# Patient Record
Sex: Male | Born: 1950 | Race: Black or African American | Hispanic: No | Marital: Married | Smoking: Former smoker
Health system: Southern US, Community
[De-identification: ages and names within clinical notes are randomized; demographics above are authoritative.]

## PROBLEM LIST (undated history)

## (undated) DIAGNOSIS — I2699 Other pulmonary embolism without acute cor pulmonale: Secondary | ICD-10-CM

## (undated) DIAGNOSIS — I10 Essential (primary) hypertension: Secondary | ICD-10-CM

## (undated) DIAGNOSIS — E785 Hyperlipidemia, unspecified: Secondary | ICD-10-CM

## (undated) DIAGNOSIS — E041 Nontoxic single thyroid nodule: Secondary | ICD-10-CM

## (undated) HISTORY — PX: SPLENECTOMY: SUR1306

## (undated) HISTORY — DX: Other pulmonary embolism without acute cor pulmonale: I26.99

## (undated) HISTORY — DX: Hyperlipidemia, unspecified: E78.5

## (undated) HISTORY — PX: CERVICAL FUSION: SHX112

## (undated) HISTORY — DX: Nontoxic single thyroid nodule: E04.1

## (undated) HISTORY — DX: Essential (primary) hypertension: I10

---

## 2006-08-19 ENCOUNTER — Ambulatory Visit: Payer: Self-pay | Admitting: Oncology

## 2010-03-23 ENCOUNTER — Emergency Department (HOSPITAL_BASED_OUTPATIENT_CLINIC_OR_DEPARTMENT_OTHER)
Admission: EM | Admit: 2010-03-23 | Discharge: 2010-03-24 | Payer: Self-pay | Source: Home / Self Care | Admitting: Emergency Medicine

## 2010-03-31 LAB — PROTIME-INR
INR: 1.47 (ref 0.00–1.49)
Prothrombin Time: 18 seconds — ABNORMAL HIGH (ref 11.6–15.2)

## 2011-07-03 IMAGING — CT CT HEAD W/O CM
4 of 5 series · 16 of 47 positions shown, 17 images · non-contrast
Comparison: None.

CT HEAD

CLINICAL DATA: 59-year-old male status post fall with pain.

CT HEAD WITHOUT CONTRAST
CT CERVICAL SPINE WITHOUT CONTRAST
TECHNIQUE: Multidetector CT imaging of the head and cervical spine
was performed following the standard protocol without intravenous
contrast.  Multiplanar CT image reconstructions of the cervical
spine were also generated.

[Series 2: head 4.8 h37s · axial · 0.42mm/px · z∈[-131,-84]mm · 2 of 32 slices shown, 3 images]
[im 11/32  brain]
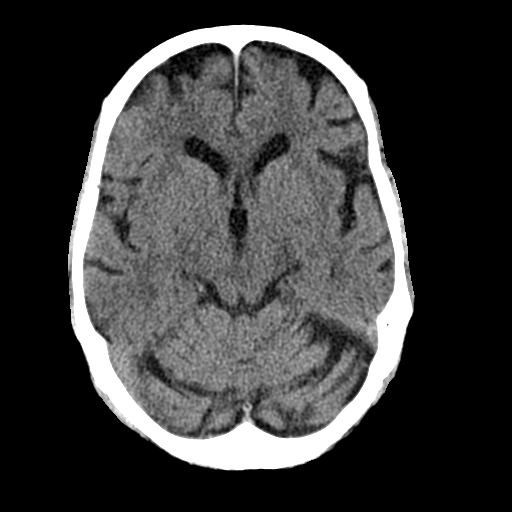
[im 11/32  bone]
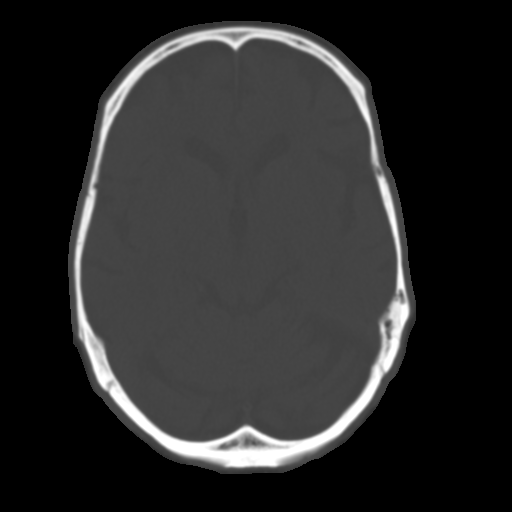
[im 21/32  brain]
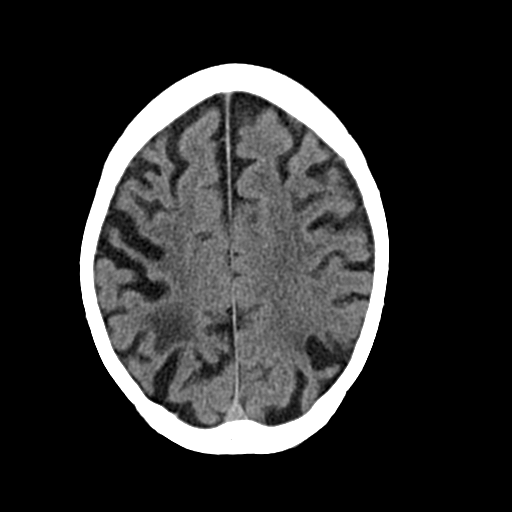

[Series 8: c_spine 2.0 coronal · coronal · 0.35mm/px · 3 of 42 slices shown]
[im 14/42  brain]
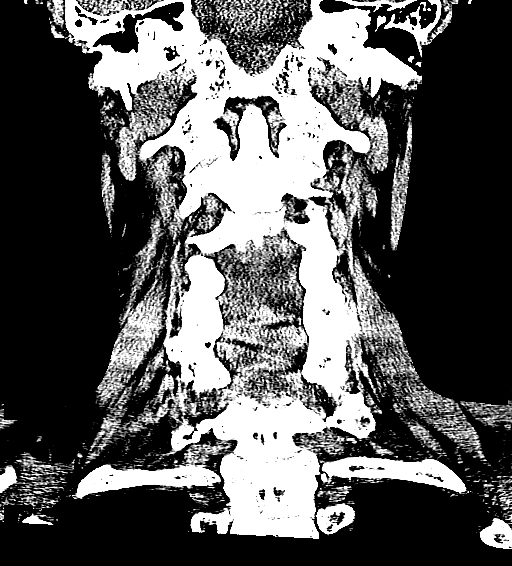
[im 19/42  brain]
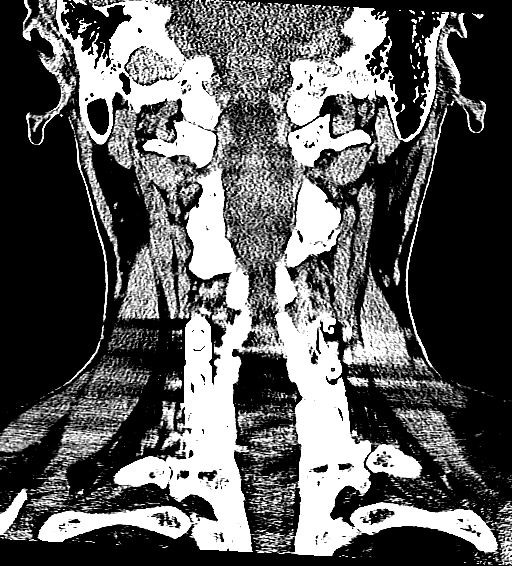
[im 23/42  brain]
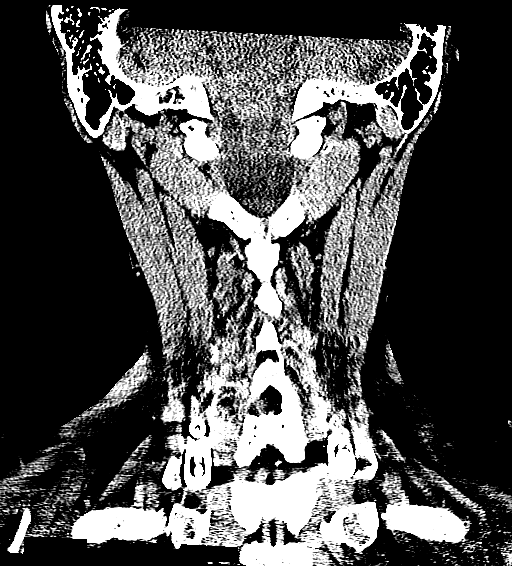

[Series 9: c_spine 2.0 sagittal · sagittal · 0.34mm/px · 3 of 44 slices shown]
[im 15/44  brain]
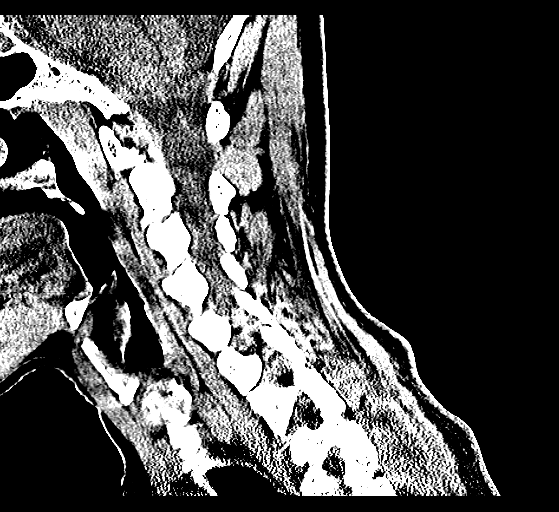
[im 22/44  brain]
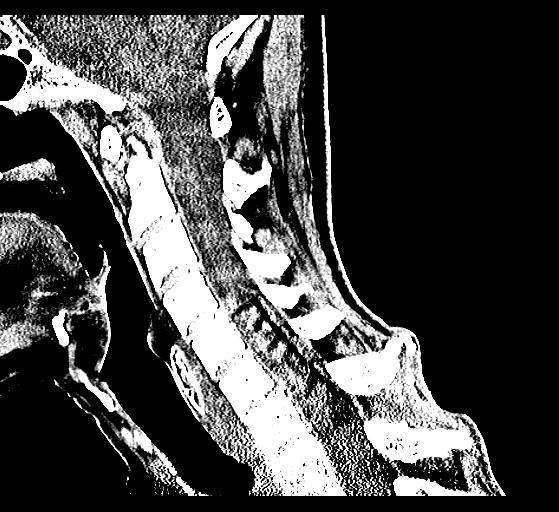
[im 29/44  brain]
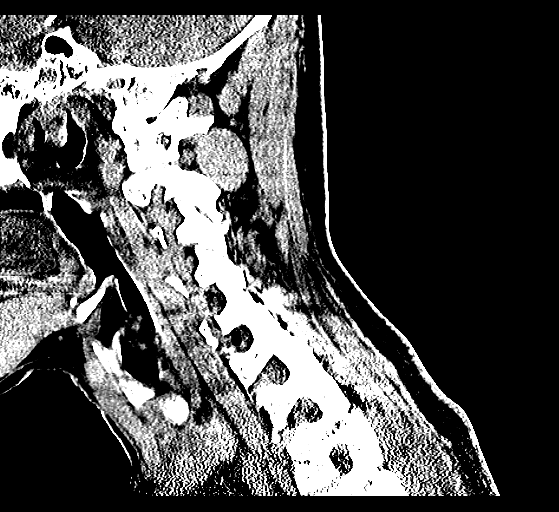

[Series 10: c_spine 2.0 orth ax · axial · 0.19mm/px · z∈[-309,-175]mm · 8 of 96 slices shown]
[im 8/96  brain]
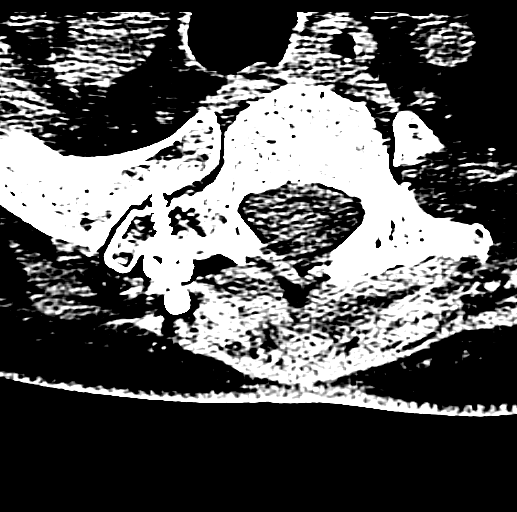
[im 22/96  brain]
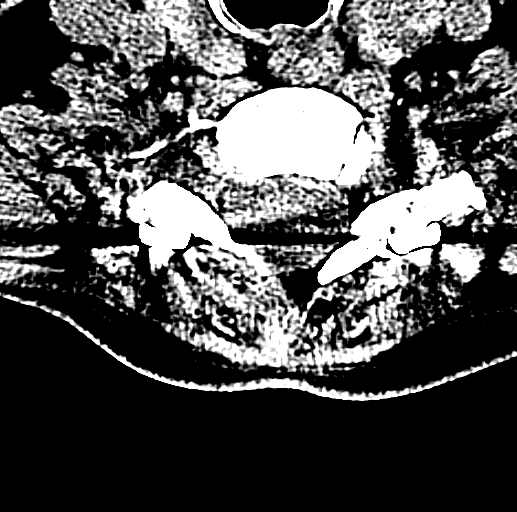
[im 30/96  brain]
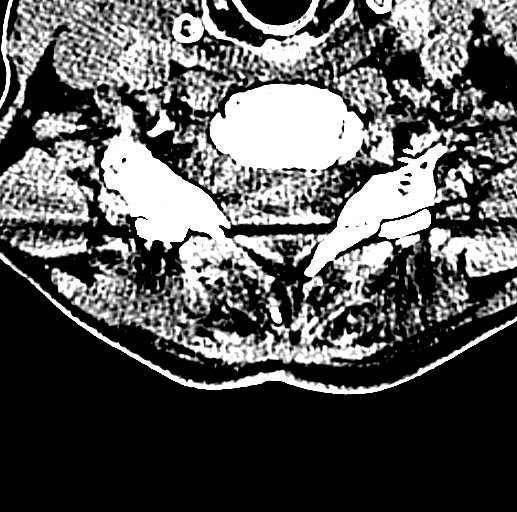
[im 44/96  brain]
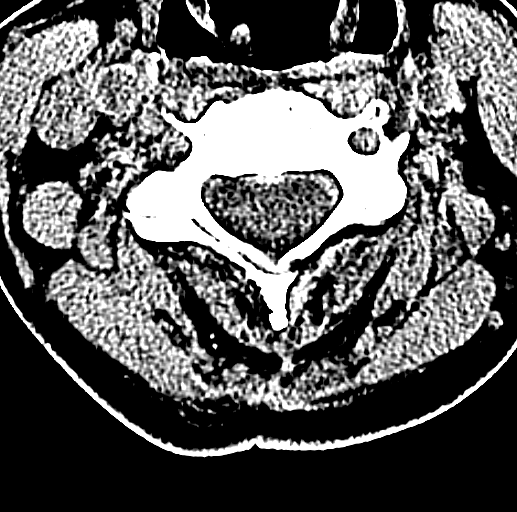
[im 52/96  brain]
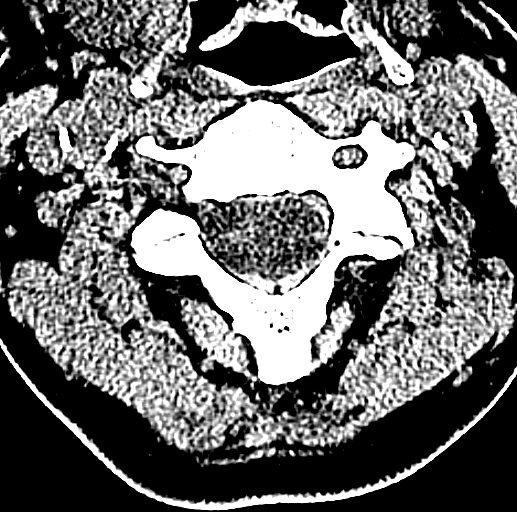
[im 66/96  brain]
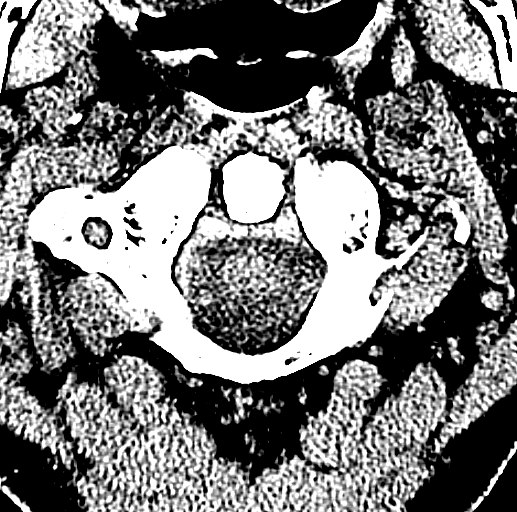
[im 74/96  brain]
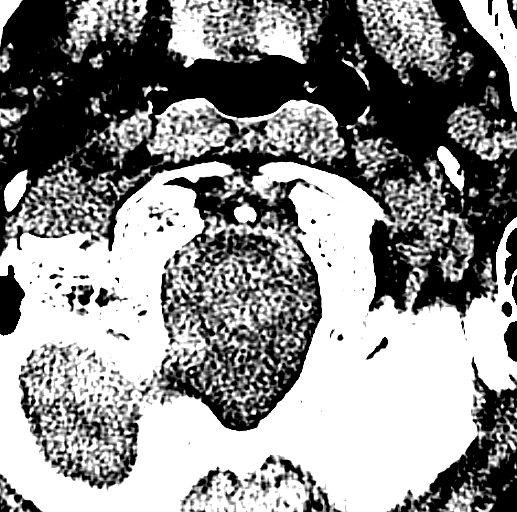
[im 88/96  brain]
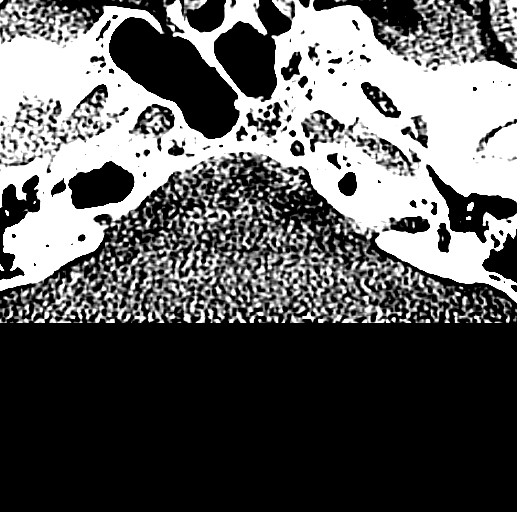

[16 of 47 positions shown; findings below may reference images not displayed]

FINDINGS: Posterior vertex scalp soft tissue injury with underlying
scalp hematoma measuring up to 10 mm in thickness. Visualized orbit
soft tissues are within normal limits.  Calcified atherosclerosis
at the skull base.  No acute osseous abnormality identified.
Visualized paranasal sinuses and mastoids are clear.

Multi focal bilateral hemispheric cortical and subcortical
encephalomalacia.  Advanced for age generalized volume loss.  No
ventriculomegaly. No midline shift, mass effect, or evidence of
mass lesion.  No acute intracranial hemorrhage identified.  No
evidence of cortically based acute infarction identified.  No
suspicious intracranial vascular hyperdensity.
IMPRESSION: 1.  Posterior scalp soft tissue injury without underlying fracture.
2.  Age advanced volume loss and ischemic sequelae. No acute
intracranial abnormality.
3.  Cervical findings are below.

CT CERVICAL SPINE
FINDINGS: Heterogeneous enlargement of the left thyroid lobe, with
hypodense nodule measuring approximate 17 mm in diameter.
Visualized lung apices are clear.  Postoperative changes to the
posterior paraspinal soft tissues.  Calcified atherosclerosis of
the carotid arteries.

Sequelae of posterior cervical spine fusion C5 through T1 with
bilateral laminar hardware.  Hardware appears intact.  There is
posterior element arthrodesis at these levels, except at C7-T1
where there is persistent posterior element lucency greater on the
right.  Partially calcified small central disc protrusion at C6-C7.

Visualized skull base is intact.  No atlanto-occipital
dissociation.  Cervicothoracic junction alignment is within normal
limits.  Bilateral posterior element alignment is within normal
limits.  No acute cervical fracture identified.  No evidence of
cervical stenosis.
IMPRESSION: 1. No acute fracture or listhesis identified in the cervical spine.
Ligamentous injury is not excluded.
2.  C5 to T1 posterior fusion sequelae.  Pseudarthrosis at C7-T1
suspected.
3.  Left thyroid hypodense nodule measuring roughly 17 mm.
Recommend nonemergent follow-up thyroid ultrasound for further
characterization.

## 2015-05-12 DIAGNOSIS — F0280 Dementia in other diseases classified elsewhere without behavioral disturbance: Secondary | ICD-10-CM | POA: Insufficient documentation

## 2015-05-12 DIAGNOSIS — G301 Alzheimer's disease with late onset: Secondary | ICD-10-CM

## 2015-05-12 DIAGNOSIS — K219 Gastro-esophageal reflux disease without esophagitis: Secondary | ICD-10-CM | POA: Insufficient documentation

## 2015-05-12 HISTORY — DX: Dementia in other diseases classified elsewhere, unspecified severity, without behavioral disturbance, psychotic disturbance, mood disturbance, and anxiety: F02.80

## 2015-05-12 HISTORY — DX: Gastro-esophageal reflux disease without esophagitis: K21.9

## 2015-09-12 DIAGNOSIS — R0989 Other specified symptoms and signs involving the circulatory and respiratory systems: Secondary | ICD-10-CM

## 2015-09-12 DIAGNOSIS — I429 Cardiomyopathy, unspecified: Secondary | ICD-10-CM | POA: Insufficient documentation

## 2015-09-12 DIAGNOSIS — E785 Hyperlipidemia, unspecified: Secondary | ICD-10-CM | POA: Insufficient documentation

## 2015-09-12 HISTORY — DX: Other specified symptoms and signs involving the circulatory and respiratory systems: R09.89

## 2015-09-12 HISTORY — DX: Hyperlipidemia, unspecified: E78.5

## 2015-09-12 HISTORY — DX: Cardiomyopathy, unspecified: I42.9

## 2015-09-26 DIAGNOSIS — I350 Nonrheumatic aortic (valve) stenosis: Secondary | ICD-10-CM | POA: Insufficient documentation

## 2015-09-26 HISTORY — DX: Nonrheumatic aortic (valve) stenosis: I35.0

## 2015-11-10 DIAGNOSIS — I10 Essential (primary) hypertension: Secondary | ICD-10-CM | POA: Insufficient documentation

## 2015-11-10 DIAGNOSIS — I2510 Atherosclerotic heart disease of native coronary artery without angina pectoris: Secondary | ICD-10-CM | POA: Insufficient documentation

## 2015-11-10 DIAGNOSIS — E041 Nontoxic single thyroid nodule: Secondary | ICD-10-CM | POA: Insufficient documentation

## 2015-11-10 DIAGNOSIS — D509 Iron deficiency anemia, unspecified: Secondary | ICD-10-CM

## 2015-11-10 DIAGNOSIS — C8190 Hodgkin lymphoma, unspecified, unspecified site: Secondary | ICD-10-CM

## 2015-11-10 HISTORY — DX: Iron deficiency anemia, unspecified: D50.9

## 2015-11-10 HISTORY — DX: Hodgkin lymphoma, unspecified, unspecified site: C81.90

## 2015-11-10 HISTORY — DX: Essential (primary) hypertension: I10

## 2015-11-10 HISTORY — DX: Atherosclerotic heart disease of native coronary artery without angina pectoris: I25.10

## 2016-11-20 ENCOUNTER — Ambulatory Visit: Payer: Self-pay | Admitting: Cardiology

## 2016-11-20 ENCOUNTER — Encounter: Payer: Self-pay | Admitting: Cardiology

## 2016-11-20 ENCOUNTER — Ambulatory Visit (INDEPENDENT_AMBULATORY_CARE_PROVIDER_SITE_OTHER): Payer: Medicare HMO | Admitting: Cardiology

## 2016-11-20 VITALS — BP 138/74 | HR 84 | Resp 10 | Ht 68.0 in | Wt 177.4 lb

## 2016-11-20 DIAGNOSIS — E785 Hyperlipidemia, unspecified: Secondary | ICD-10-CM | POA: Diagnosis not present

## 2016-11-20 DIAGNOSIS — I420 Dilated cardiomyopathy: Secondary | ICD-10-CM | POA: Diagnosis not present

## 2016-11-20 DIAGNOSIS — I350 Nonrheumatic aortic (valve) stenosis: Secondary | ICD-10-CM | POA: Diagnosis not present

## 2016-11-20 DIAGNOSIS — I10 Essential (primary) hypertension: Secondary | ICD-10-CM

## 2016-11-20 DIAGNOSIS — G301 Alzheimer's disease with late onset: Secondary | ICD-10-CM | POA: Diagnosis not present

## 2016-11-20 DIAGNOSIS — F0280 Dementia in other diseases classified elsewhere without behavioral disturbance: Secondary | ICD-10-CM | POA: Diagnosis not present

## 2016-11-20 MED ORDER — CARVEDILOL 3.125 MG PO TABS
3.1250 mg | ORAL_TABLET | Freq: Two times a day (BID) | ORAL | 11 refills | Status: DC
Start: 2016-11-20 — End: 2019-08-16

## 2016-11-20 MED ORDER — FUROSEMIDE 20 MG PO TABS: 20.0000 mg | ORAL_TABLET | Freq: Every day | ORAL | 11 refills | Status: DC

## 2016-11-20 MED ORDER — PRAVASTATIN SODIUM 20 MG PO TABS: 20.0000 mg | ORAL_TABLET | Freq: Every day | ORAL | 11 refills | Status: DC

## 2016-11-20 MED ORDER — LISINOPRIL 10 MG PO TABS
10.0000 mg | ORAL_TABLET | Freq: Every day | ORAL | 11 refills | Status: DC
Start: 2016-11-20 — End: 2018-01-20

## 2016-11-20 MED ORDER — SPIRONOLACTONE 25 MG PO TABS: 25.0000 mg | ORAL_TABLET | Freq: Every day | ORAL | 11 refills | Status: DC

## 2016-11-20 NOTE — Progress Notes (Signed)
Cardiology Office Note:    Date:  11/20/2016   ID:  Ernest Frederick, DOB 05-16-1950, MRN [id number]  PCP:  Algis Greenhouse, MD  Cardiologist:  Jenne Campus, MD    Referring MD: No ref. provider found   Chief Complaint  Patient presents with  . Follow-up  History of cardiomyopathy  History of Present Illness:    Ernest Frederick is a 66 y.o. male  doing well, denies having any chest pain tightness squeezing pressure burning chest. As described some weakness and fatigue. Last time we check his echocardiogram showed improvement left ventricular ejection fraction but the biggest issue from the right now is presence of aortic stenosis. He will have echocardiogram done to assess the degree of aortic stenosis. Denies having any dizziness chest tightness  Past Medical History:  Diagnosis Date  . Hyperlipidemia   . Hypertension   . Pulmonary embolism ([place])   . Thyroid nodule     Past Surgical History:  Procedure Laterality Date  . CERVICAL FUSION    . SPLENECTOMY      Current Medications: Current Meds  Medication Sig  . carvedilol (COREG) 3.125 MG tablet Take 3.125 mg by mouth 2 (two) times daily.  Marland Kitchen donepezil (ARICEPT) 10 MG tablet Take 10 mg by mouth daily.  . furosemide (LASIX) 20 MG tablet Take 1 tablet by mouth daily.  Marland Kitchen lisinopril (PRINIVIL,ZESTRIL) 10 MG tablet Take 10 mg by mouth daily.  . Multiple Vitamin (MULTIVITAMIN) capsule Take 1 capsule by mouth daily.  . nitroGLYCERIN (NITROSTAT) 0.4 MG SL tablet Place 1 tablet under the tongue as needed for chest pain.  . pravastatin (PRAVACHOL) 20 MG tablet Take 1 tablet by mouth daily.  Marland Kitchen spironolactone (ALDACTONE) 25 MG tablet Take 25 mg by mouth daily.  Marland Kitchen venlafaxine XR (EFFEXOR-XR) 75 MG 24 hr capsule Take 75 mg by mouth daily.     Allergies:   Hydrocodone-acetaminophen and Simvastatin   Social History   Social History  . Marital status: Single    Spouse name: N/A  . Number of children: N/A  . Years of education:  N/A   Social History Main Topics  . Smoking status: Former Research scientist (life sciences)  . Smokeless tobacco: Former Systems developer  . Alcohol use No  . Drug use: No  . Sexual activity: Not Asked   Other Topics Concern  . None   Social History Narrative  . None     Family History: The patient's family history includes Heart murmur in his mother; Prostate cancer in his father. ROS:   Please see the history of present illness.    All 14 point review of systems negative except as described per history of present illness  EKGs/Labs/Other Studies Reviewed:      Recent Labs: No results found for requested labs within last 8760 hours.  Recent Lipid Panel No results found for: CHOL, TRIG, HDL, CHOLHDL, VLDL, LDLCALC, LDLDIRECT  Physical Exam:    VS:  BP 138/74   Pulse 84   Resp 10   Ht 5\' 8"  (1.727 m)   Wt 177 lb 6.4 oz (80.5 kg)   BMI 26.97 kg/m     Wt Readings from Last 3 Encounters:  11/20/16 177 lb 6.4 oz (80.5 kg)     GEN:  Well nourished, well developed in no acute distress HEENT: Normal NECK: No JVD; No carotid bruits LYMPHATICS: No lymphadenopathy CARDIAC: RRR, Systolic ejection murmur grade 2-3/6 best heard at the right upper portion of the sternum, no rubs, no gallops  RESPIRATORY:  Clear to auscultation without rales, wheezing or rhonchi  ABDOMEN: Soft, non-tender, non-distended MUSCULOSKELETAL:  No edema; No deformity  SKIN: Warm and dry LOWER EXTREMITIES: no swelling NEUROLOGIC:  Alert and oriented x 3 PSYCHIATRIC:  Normal affect   ASSESSMENT:    1. Nonrheumatic aortic valve stenosis   2. Benign hypertension   3. Dilated cardiomyopathy ([place])   4. Late onset Alzheimer's disease without behavioral disturbance   5. Dyslipidemia    PLAN:    In order of problems listed above:  1. Aortic stenosis: According and will be done to assess it. 2. Essential hypertension: Blood pressure well-controlled continue present management. 3. History of dilated cardiomyopathy: We'll repeat  echocardiogram to reassess. 4. Dementia: We'll continue present management. 5. Dyslipidemia: We'll call primary care physician to get his fasting lipid profile   Medication Adjustments/Labs and Tests Ordered: Current medicines are reviewed at length with the patient today.  Concerns regarding medicines are outlined above.  No orders of the defined types were placed in this encounter.  Medication changes: No orders of the defined types were placed in this encounter.   Signed, Park Liter, MD, [facility] 11/20/2016 [place]

## 2016-11-20 NOTE — Patient Instructions (Signed)
Medication Instructions:  Your physician recommends that you continue on your current medications as directed. Please refer to the Current Medication list given to you today.  Labwork: None   Testing/Procedures: Your physician has requested that you have an echocardiogram. Echocardiography is a painless test that uses sound waves to create images of your heart. It provides your doctor with information about the size and shape of your heart and how well your heart's chambers and valves are working. This procedure takes approximately one hour. There are no restrictions for this procedure.   Follow-Up: Your physician wants you to follow-up in: 6 months. You will receive a reminder letter in the mail two months in advance. If you don't receive a letter, please call our office to schedule the follow-up appointment.  Any Other Special Instructions Will Be Listed Below (If Applicable).  Please note that any paperwork needing to be filled out by the provider will need to be addressed at the front desk prior to seeing the provider. Please note that any paperwork FMLA, Disability or other documents regarding health condition is subject to a $25.00 charge that must be received prior to completion of paperwork in the form of a money order or check.    If you need a refill on your cardiac medications before your next appointment, please call your pharmacy.

## 2016-11-23 NOTE — Addendum Note (Signed)
Addended by: Kathyrn Sheriff on: 11/23/2016 04:22 PM   Modules accepted: Orders

## 2016-12-17 ENCOUNTER — Ambulatory Visit (HOSPITAL_BASED_OUTPATIENT_CLINIC_OR_DEPARTMENT_OTHER): Payer: Medicare HMO

## 2016-12-17 ENCOUNTER — Ambulatory Visit (HOSPITAL_BASED_OUTPATIENT_CLINIC_OR_DEPARTMENT_OTHER)
Admission: RE | Admit: 2016-12-17 | Discharge: 2016-12-17 | Disposition: A | Discharge status: Home / Self Care | Payer: Medicare HMO | Source: Ambulatory Visit | Attending: Cardiology | Admitting: Cardiology

## 2016-12-17 DIAGNOSIS — I350 Nonrheumatic aortic (valve) stenosis: Secondary | ICD-10-CM | POA: Insufficient documentation

## 2016-12-17 DIAGNOSIS — E785 Hyperlipidemia, unspecified: Secondary | ICD-10-CM | POA: Insufficient documentation

## 2016-12-17 DIAGNOSIS — I2510 Atherosclerotic heart disease of native coronary artery without angina pectoris: Secondary | ICD-10-CM | POA: Insufficient documentation

## 2016-12-17 NOTE — Progress Notes (Signed)
  Echocardiogram 2D Echocardiogram has been performed.  Brandi Armato T Quasim Doyon 12/17/2016, 10:16 AM

## 2017-02-25 DIAGNOSIS — Z5320 Procedure and treatment not carried out because of patient's decision for unspecified reasons: Secondary | ICD-10-CM | POA: Insufficient documentation

## 2017-02-25 HISTORY — DX: Procedure and treatment not carried out because of patient's decision for unspecified reasons: Z53.20

## 2017-04-07 DIAGNOSIS — Z0000 Encounter for general adult medical examination without abnormal findings: Secondary | ICD-10-CM | POA: Insufficient documentation

## 2017-04-07 HISTORY — DX: Encounter for general adult medical examination without abnormal findings: Z00.00

## 2017-06-17 ENCOUNTER — Encounter: Payer: Self-pay | Admitting: Cardiology

## 2017-06-17 ENCOUNTER — Ambulatory Visit (INDEPENDENT_AMBULATORY_CARE_PROVIDER_SITE_OTHER): Payer: Medicare HMO | Admitting: Cardiology

## 2017-06-17 VITALS — BP 124/72 | HR 77 | Ht 68.0 in | Wt 171.8 lb

## 2017-06-17 DIAGNOSIS — I420 Dilated cardiomyopathy: Secondary | ICD-10-CM | POA: Diagnosis not present

## 2017-06-17 DIAGNOSIS — E785 Hyperlipidemia, unspecified: Secondary | ICD-10-CM

## 2017-06-17 DIAGNOSIS — I350 Nonrheumatic aortic (valve) stenosis: Secondary | ICD-10-CM | POA: Diagnosis not present

## 2017-06-17 DIAGNOSIS — I2510 Atherosclerotic heart disease of native coronary artery without angina pectoris: Secondary | ICD-10-CM | POA: Diagnosis not present

## 2017-06-17 MED ORDER — NITROGLYCERIN 0.4 MG SL SUBL: 0.4000 mg | SUBLINGUAL_TABLET | SUBLINGUAL | 5 refills | Status: AC | PRN

## 2017-06-17 NOTE — Progress Notes (Signed)
Cardiology Office Note:    Date:  06/17/2017   ID:  Ernest Frederick, DOB 1950-04-13, MRN [id number]  PCP:  Algis Greenhouse, MD  Cardiologist:  Jenne Campus, MD    Referring MD: Algis Greenhouse, MD   Chief Complaint  Patient presents with  . Follow-up  I had some shortness of breath yesterday  History of Present Illness:    Ernest Frederick is a 67 y.o. male with history of cardiomyopathy which was nonischemic in origin with normalization of left ventricular ejection fraction, noncritical aortic stenosis he comes to our office for regular follow-up interestingly he still works quite hard physically.  Yesterday he was doing something and developed shortness of breath he tells me that this is unusual for him normal he was able to do it this time he developed shortness of breath there is no chest pain tightness squeezing pressure burning chest no dizziness no passing out.  Last echocardiogram we have in the records from October which showed noncritical aortic stenosis however stenosis is at least moderate.  We need to repeat his echocardiogram to make sure we are not dealing with critical aortic stenosis.  Past Medical History:  Diagnosis Date  . Hyperlipidemia   . Hypertension   . Pulmonary embolism ([place])   . Thyroid nodule     Past Surgical History:  Procedure Laterality Date  . CERVICAL FUSION    . SPLENECTOMY      Current Medications: Current Meds  Medication Sig  . carvedilol (COREG) 3.125 MG tablet Take 1 tablet (3.125 mg total) by mouth 2 (two) times daily.  Marland Kitchen donepezil (ARICEPT) 10 MG tablet Take 10 mg by mouth daily.  Marland Kitchen lisinopril (PRINIVIL,ZESTRIL) 10 MG tablet Take 1 tablet (10 mg total) by mouth daily.  . Multiple Vitamin (MULTIVITAMIN) capsule Take 1 capsule by mouth daily.  . nitroGLYCERIN (NITROSTAT) 0.4 MG SL tablet Place 1 tablet under the tongue as needed for chest pain.  Marland Kitchen omeprazole (PRILOSEC) 20 MG capsule Take 20 mg by mouth daily.  . pravastatin  (PRAVACHOL) 20 MG tablet Take 1 tablet (20 mg total) by mouth daily.  Marland Kitchen spironolactone (ALDACTONE) 25 MG tablet Take 1 tablet (25 mg total) by mouth daily.  Marland Kitchen venlafaxine XR (EFFEXOR-XR) 75 MG 24 hr capsule Take 75 mg by mouth daily.     Allergies:   Hydrocodone-acetaminophen and Simvastatin   Social History   Socioeconomic History  . Marital status: Married    Spouse name: Not on file  . Number of children: Not on file  . Years of education: Not on file  . Highest education level: Not on file  Occupational History  . Not on file  Social Needs  . Financial resource strain: Not on file  . Food insecurity:    Worry: Not on file    Inability: Not on file  . Transportation needs:    Medical: Not on file    Non-medical: Not on file  Tobacco Use  . Smoking status: Former Research scientist (life sciences)  . Smokeless tobacco: Former Network engineer and Sexual Activity  . Alcohol use: No  . Drug use: No  . Sexual activity: Not on file  Lifestyle  . Physical activity:    Days per week: Not on file    Minutes per session: Not on file  . Stress: Not on file  Relationships  . Social connections:    Talks on phone: Not on file    Gets together: Not on file  Attends religious service: Not on file    Active member of club or organization: Not on file    Attends meetings of clubs or organizations: Not on file    Relationship status: Not on file  Other Topics Concern  . Not on file  Social History Narrative  . Not on file     Family History: The patient's family history includes Heart murmur in his mother; Prostate cancer in his father. ROS:   Please see the history of present illness.    All 14 point review of systems negative except as described per history of present illness  EKGs/Labs/Other Studies Reviewed:      Recent Labs: No results found for requested labs within last 8760 hours.  Recent Lipid Panel No results found for: CHOL, TRIG, HDL, CHOLHDL, VLDL, LDLCALC, LDLDIRECT  Physical  Exam:    VS:  BP 124/72   Pulse 77   Ht 5\' 8"  (1.727 m)   Wt 171 lb 12.8 oz (77.9 kg)   SpO2 98%   BMI 26.12 kg/m     Wt Readings from Last 3 Encounters:  06/17/17 171 lb 12.8 oz (77.9 kg)  11/20/16 177 lb 6.4 oz (80.5 kg)     GEN:  Well nourished, well developed in no acute distress HEENT: Normal NECK: No JVD; No carotid bruits LYMPHATICS: No lymphadenopathy CARDIAC: RRR, stomach ejection murmur grade 2/6 to 3/6 best heard at the right upper portion of the sternum.  Tones are overall distant., no rubs, no gallops RESPIRATORY:  Clear to auscultation without rales, wheezing or rhonchi  ABDOMEN: Soft, non-tender, non-distended MUSCULOSKELETAL:  No edema; No deformity  SKIN: Warm and dry LOWER EXTREMITIES: no swelling NEUROLOGIC:  Alert and oriented x 3 PSYCHIATRIC:  Normal affect   ASSESSMENT:    1. Nonrheumatic aortic valve stenosis   2. Dilated cardiomyopathy ([place])   3. Coronary artery disease involving native coronary artery of native heart without angina pectoris   4. Dyslipidemia    PLAN:    In order of problems listed above:  1. Nonrheumatic aortic valve stenosis: Echocardiogram will be repeated 2. Still dilated cardiomyopathy will continue with lisinopril as well as spironolactone and carvedilol.  Echocardiogram will be repeated to recheck left ventricular ejection fraction. 3. Coronary artery disease: Asymptomatic if he will required intervention of his aortic valve cardiac catheterization will be performed. 4. Dyslipidemia: We will continue with statin.   Medication Adjustments/Labs and Tests Ordered: Current medicines are reviewed at length with the patient today.  Concerns regarding medicines are outlined above.  No orders of the defined types were placed in this encounter.  Medication changes: No orders of the defined types were placed in this encounter.   Signed, Park Liter, MD, [facility] 06/17/2017 10:14 AM    [place]

## 2017-06-17 NOTE — Addendum Note (Signed)
Addended by: Aleatha Borer on: 06/17/2017 10:22 AM   Modules accepted: Orders

## 2017-06-17 NOTE — Patient Instructions (Signed)
Medication Instructions:  Your physician recommends that you continue on your current medications as directed. Please refer to the Current Medication list given to you today.  Labwork: None ordered  Testing/Procedures: Your physician has requested that you have an echocardiogram. Echocardiography is a painless test that uses sound waves to create images of your heart. It provides your doctor with information about the size and shape of your heart and how well your heart's chambers and valves are working. This procedure takes approximately one hour. There are no restrictions for this procedure.  Follow-Up: Your physician recommends that you schedule a follow-up appointment in: 1 month with Dr. Krasowski   Any Other Special Instructions Will Be Listed Below (If Applicable).     If you need a refill on your cardiac medications before your next appointment, please call your pharmacy.   

## 2017-07-22 ENCOUNTER — Other Ambulatory Visit (HOSPITAL_BASED_OUTPATIENT_CLINIC_OR_DEPARTMENT_OTHER): Payer: Medicare HMO

## 2017-07-22 ENCOUNTER — Ambulatory Visit (HOSPITAL_BASED_OUTPATIENT_CLINIC_OR_DEPARTMENT_OTHER)
Admission: RE | Admit: 2017-07-22 | Discharge: 2017-07-22 | Disposition: A | Discharge status: Home / Self Care | Payer: Medicare HMO | Source: Ambulatory Visit | Attending: Cardiology | Admitting: Cardiology

## 2017-07-22 DIAGNOSIS — I2510 Atherosclerotic heart disease of native coronary artery without angina pectoris: Secondary | ICD-10-CM | POA: Diagnosis not present

## 2017-07-22 DIAGNOSIS — I350 Nonrheumatic aortic (valve) stenosis: Secondary | ICD-10-CM

## 2017-07-22 DIAGNOSIS — I340 Nonrheumatic mitral (valve) insufficiency: Secondary | ICD-10-CM | POA: Insufficient documentation

## 2017-07-22 DIAGNOSIS — I352 Nonrheumatic aortic (valve) stenosis with insufficiency: Secondary | ICD-10-CM | POA: Diagnosis not present

## 2017-07-22 NOTE — Progress Notes (Signed)
Echocardiogram 2D Echocardiogram has been performed.  Ernest Frederick 07/22/2017, 10:13 AM

## 2017-08-12 ENCOUNTER — Ambulatory Visit: Payer: Medicare HMO | Admitting: Cardiology

## 2017-09-23 ENCOUNTER — Ambulatory Visit (INDEPENDENT_AMBULATORY_CARE_PROVIDER_SITE_OTHER): Payer: Medicare HMO | Admitting: Cardiology

## 2017-09-23 ENCOUNTER — Encounter: Payer: Self-pay | Admitting: Cardiology

## 2017-09-23 VITALS — BP 128/70 | HR 69 | Ht 68.0 in | Wt 164.0 lb

## 2017-09-23 DIAGNOSIS — I2510 Atherosclerotic heart disease of native coronary artery without angina pectoris: Secondary | ICD-10-CM

## 2017-09-23 DIAGNOSIS — I420 Dilated cardiomyopathy: Secondary | ICD-10-CM | POA: Diagnosis not present

## 2017-09-23 DIAGNOSIS — I350 Nonrheumatic aortic (valve) stenosis: Secondary | ICD-10-CM | POA: Diagnosis not present

## 2017-09-23 DIAGNOSIS — I10 Essential (primary) hypertension: Secondary | ICD-10-CM | POA: Diagnosis not present

## 2017-09-23 NOTE — Progress Notes (Signed)
Cardiology Office Note:    Date:  09/23/2017   ID:  Ernest Frederick, DOB Jun 20, 1950, MRN [id number]  PCP:  Algis Greenhouse, MD  Cardiologist:  Jenne Campus, MD    Referring MD: Algis Greenhouse, MD   No chief complaint on file. Doing well  History of Present Illness:    Ernest Frederick is a 67 y.o. male with aortic stenosis which is moderate, history of cardiomyopathy with diminished ejection fraction however latest echocardiogram done in May showed preserved left ventricular ejection fraction.  Overall he is doing well he works he has no difficulty doing this no shortness of breath chest pain tightness squeezing pressure burning chest overall doing well  Past Medical History:  Diagnosis Date  . Hyperlipidemia   . Hypertension   . Pulmonary embolism ([place])   . Thyroid nodule     Past Surgical History:  Procedure Laterality Date  . CERVICAL FUSION    . SPLENECTOMY      Current Medications: Current Meds  Medication Sig  . carvedilol (COREG) 3.125 MG tablet Take 1 tablet (3.125 mg total) by mouth 2 (two) times daily.  Marland Kitchen donepezil (ARICEPT) 10 MG tablet Take 10 mg by mouth daily.  Marland Kitchen lisinopril (PRINIVIL,ZESTRIL) 10 MG tablet Take 1 tablet (10 mg total) by mouth daily.  . Multiple Vitamin (MULTIVITAMIN) capsule Take 1 capsule by mouth daily.  . nitroGLYCERIN (NITROSTAT) 0.4 MG SL tablet Place 1 tablet (0.4 mg total) under the tongue as needed for chest pain.  Marland Kitchen omeprazole (PRILOSEC) 20 MG capsule Take 20 mg by mouth daily.  . pravastatin (PRAVACHOL) 20 MG tablet Take 1 tablet (20 mg total) by mouth daily.  Marland Kitchen spironolactone (ALDACTONE) 25 MG tablet Take 1 tablet (25 mg total) by mouth daily.  Marland Kitchen venlafaxine XR (EFFEXOR-XR) 75 MG 24 hr capsule Take 75 mg by mouth daily.     Allergies:   Hydrocodone-acetaminophen and Simvastatin   Social History   Socioeconomic History  . Marital status: Married    Spouse name: Not on file  . Number of children: Not on file  . Years of  education: Not on file  . Highest education level: Not on file  Occupational History  . Not on file  Social Needs  . Financial resource strain: Not on file  . Food insecurity:    Worry: Not on file    Inability: Not on file  . Transportation needs:    Medical: Not on file    Non-medical: Not on file  Tobacco Use  . Smoking status: Former Research scientist (life sciences)  . Smokeless tobacco: Former Network engineer and Sexual Activity  . Alcohol use: No  . Drug use: No  . Sexual activity: Not on file  Lifestyle  . Physical activity:    Days per week: Not on file    Minutes per session: Not on file  . Stress: Not on file  Relationships  . Social connections:    Talks on phone: Not on file    Gets together: Not on file    Attends religious service: Not on file    Active member of club or organization: Not on file    Attends meetings of clubs or organizations: Not on file    Relationship status: Not on file  Other Topics Concern  . Not on file  Social History Narrative  . Not on file     Family History: The patient's family history includes Heart murmur in his mother; Prostate cancer in his  father. ROS:   Please see the history of present illness.    All 14 point review of systems negative except as described per history of present illness  EKGs/Labs/Other Studies Reviewed:      Recent Labs: No results found for requested labs within last 8760 hours.  Recent Lipid Panel No results found for: CHOL, TRIG, HDL, CHOLHDL, VLDL, LDLCALC, LDLDIRECT  Physical Exam:    VS:  BP 128/70 (BP Location: Right Arm, Patient Position: Sitting, Cuff Size: Normal)   Pulse 69   Ht 5\' 8"  (1.727 m)   Wt 164 lb (74.4 kg)   SpO2 98%   BMI 24.94 kg/m     Wt Readings from Last 3 Encounters:  09/23/17 164 lb (74.4 kg)  06/17/17 171 lb 12.8 oz (77.9 kg)  11/20/16 177 lb 6.4 oz (80.5 kg)     GEN:  Well nourished, well developed in no acute distress HEENT: Normal NECK: No JVD; No carotid  bruits LYMPHATICS: No lymphadenopathy CARDIAC: RRR, no murmurs, no rubs, no gallops RESPIRATORY:  Clear to auscultation without rales, wheezing or rhonchi  ABDOMEN: Soft, non-tender, non-distended MUSCULOSKELETAL:  No edema; No deformity  SKIN: Warm and dry LOWER EXTREMITIES: no swelling NEUROLOGIC:  Alert and oriented x 3 PSYCHIATRIC:  Normal affect   ASSESSMENT:    1. Nonrheumatic aortic valve stenosis   2. Benign hypertension   3. Dilated cardiomyopathy ([place])   4. Coronary artery disease involving native coronary artery of native heart without angina pectoris    PLAN:    In order of problems listed above:  1. Nonrheumatic aortic valve stenosis noncritical will repeat echocardiogram in 6 months.  He does not have any signs or symptoms of critical aortic valve. 2. Benign essential hypertension doing well from that point review continue present management 3. Dilated cardiomyopathy last echocardiogram showed preserved left ventricular ejection fraction. 4. Coronary artery disease stable.  See him back in my office in about 6 months or sooner if he has a problem   Medication Adjustments/Labs and Tests Ordered: Current medicines are reviewed at length with the patient today.  Concerns regarding medicines are outlined above.  No orders of the defined types were placed in this encounter.  Medication changes: No orders of the defined types were placed in this encounter.   Signed, Park Liter, MD, [facility] 09/23/2017 11:08 AM    [place]

## 2017-09-23 NOTE — Patient Instructions (Addendum)

## 2017-12-07 ENCOUNTER — Other Ambulatory Visit: Payer: Self-pay | Admitting: *Deleted

## 2017-12-07 MED ORDER — SPIRONOLACTONE 25 MG PO TABS: 25.0000 mg | ORAL_TABLET | Freq: Every day | ORAL | 2 refills | Status: DC

## 2017-12-07 NOTE — Telephone Encounter (Signed)
Refill for [facility]actone sent to [facility] in [place].

## 2018-01-06 ENCOUNTER — Telehealth: Payer: Self-pay | Admitting: Cardiology

## 2018-01-06 MED ORDER — PRAVASTATIN SODIUM 20 MG PO TABS: 20.0000 mg | ORAL_TABLET | Freq: Every day | ORAL | 1 refills | Status: DC

## 2018-01-06 NOTE — Telephone Encounter (Signed)
[facility] [address] [place]

## 2018-01-06 NOTE — Telephone Encounter (Signed)
Refilled pravastatin 20 mg daily. 

## 2018-01-06 NOTE — Addendum Note (Signed)
Addended by: Ashok Norris on: 01/06/2018 10:12 AM   Modules accepted: Orders

## 2018-01-20 ENCOUNTER — Telehealth: Payer: Self-pay | Admitting: Cardiology

## 2018-01-20 MED ORDER — LISINOPRIL 10 MG PO TABS: 10.0000 mg | ORAL_TABLET | Freq: Every day | ORAL | 1 refills | Status: DC

## 2018-01-20 NOTE — Telephone Encounter (Signed)
°*  STAT* If patient is at the pharmacy, call can be transferred to refill team.   1. Which medications need to be refilled? (please list name of each medication and dose if known) Lisinopril 10 mg takes 1 daily   2. Which pharmacy/location (including street and city if local pharmacy) is medication to be sent to? [facility]  3. Do they need a 30 day or 90 day supply? [place]

## 2018-01-20 NOTE — Telephone Encounter (Signed)
Lisinopril 10 mg daily refilled

## 2018-04-28 ENCOUNTER — Other Ambulatory Visit: Payer: Self-pay

## 2018-04-28 DIAGNOSIS — I10 Essential (primary) hypertension: Secondary | ICD-10-CM

## 2018-04-28 MED ORDER — LISINOPRIL 10 MG PO TABS: 10.0000 mg | ORAL_TABLET | Freq: Every day | ORAL | 2 refills | Status: DC

## 2018-04-28 MED ORDER — PRAVASTATIN SODIUM 20 MG PO TABS: 20.0000 mg | ORAL_TABLET | Freq: Every day | ORAL | 2 refills | Status: DC

## 2018-08-31 ENCOUNTER — Other Ambulatory Visit: Payer: Self-pay | Admitting: Cardiology

## 2018-10-01 ENCOUNTER — Other Ambulatory Visit: Payer: Self-pay | Admitting: Cardiology

## 2018-10-11 ENCOUNTER — Other Ambulatory Visit: Payer: Self-pay | Admitting: Cardiology

## 2019-03-03 ENCOUNTER — Other Ambulatory Visit: Payer: Self-pay | Admitting: Cardiology

## 2019-03-31 ENCOUNTER — Other Ambulatory Visit: Payer: Self-pay | Admitting: Cardiology

## 2019-04-05 ENCOUNTER — Other Ambulatory Visit: Payer: Self-pay

## 2019-04-05 MED ORDER — PRAVASTATIN SODIUM 20 MG PO TABS: 20.0000 mg | ORAL_TABLET | Freq: Every day | ORAL | 0 refills | Status: DC

## 2019-04-17 ENCOUNTER — Other Ambulatory Visit: Payer: Self-pay

## 2019-04-17 DIAGNOSIS — I10 Essential (primary) hypertension: Secondary | ICD-10-CM

## 2019-04-17 MED ORDER — LISINOPRIL 10 MG PO TABS: 10.0000 mg | ORAL_TABLET | Freq: Every day | ORAL | 0 refills | Status: DC

## 2019-04-17 NOTE — Progress Notes (Signed)
Received pharmacy request for refill lisinopril 10 MG. Patient hasnt been seen since 2019, sent 30 day refill.

## 2019-04-19 ENCOUNTER — Other Ambulatory Visit: Payer: Self-pay | Admitting: Cardiology

## 2019-05-04 ENCOUNTER — Other Ambulatory Visit: Payer: Self-pay | Admitting: Cardiology

## 2019-05-24 ENCOUNTER — Other Ambulatory Visit: Payer: Self-pay

## 2019-05-24 MED ORDER — PRAVASTATIN SODIUM 20 MG PO TABS: 20.0000 mg | ORAL_TABLET | Freq: Every day | ORAL | 0 refills | Status: DC

## 2019-06-02 ENCOUNTER — Other Ambulatory Visit: Payer: Self-pay | Admitting: Cardiology

## 2019-06-02 DIAGNOSIS — I10 Essential (primary) hypertension: Secondary | ICD-10-CM

## 2019-06-14 ENCOUNTER — Other Ambulatory Visit: Payer: Self-pay

## 2019-06-18 ENCOUNTER — Other Ambulatory Visit: Payer: Self-pay | Admitting: Cardiology

## 2019-06-18 DIAGNOSIS — I10 Essential (primary) hypertension: Secondary | ICD-10-CM

## 2019-07-13 ENCOUNTER — Other Ambulatory Visit: Payer: Self-pay | Admitting: Cardiology

## 2019-07-13 DIAGNOSIS — I10 Essential (primary) hypertension: Secondary | ICD-10-CM

## 2019-07-18 ENCOUNTER — Other Ambulatory Visit: Payer: Self-pay | Admitting: Cardiology

## 2019-07-18 DIAGNOSIS — I10 Essential (primary) hypertension: Secondary | ICD-10-CM

## 2019-07-18 MED ORDER — LISINOPRIL 10 MG PO TABS: 10.0000 mg | ORAL_TABLET | Freq: Every day | ORAL | 0 refills | Status: DC

## 2019-07-18 MED ORDER — SPIRONOLACTONE 25 MG PO TABS: 25.0000 mg | ORAL_TABLET | Freq: Every day | ORAL | 0 refills | Status: DC

## 2019-07-18 NOTE — Telephone Encounter (Signed)
Spoke to the patients son just now and he let me know that the he has been in the hospital for quite some time and he is the one that normally keeps up with his fathers medications and appointments. He apologizes that they have not been able to follow up with Dr. Agustin Cree sooner but they are coming in for an appointment on 08/01/19. I sent in a 30 day refill for the patient so that he has his medications until his appointment. No other issues or concerns were noted.    Encouraged patient/son to call back with any questions or concerns.

## 2019-07-18 NOTE — Telephone Encounter (Signed)
*  STAT* If patient is at the pharmacy, call can be transferred to refill team.   1. Which medications need to be refilled? (please list name of each medication and dose if known)   spironolactone (ALDACTONE) 25 MG tablet lisinopril (ZESTRIL) 10 MG tablet  2. Which pharmacy/location (including street and city if local pharmacy) is medication to be sent to?  [id number] - [place], [place]  3. Do they need a 30 day or 90 day supply? 90 Pt does not have an appt until 08-01-19  Pt is out of medication.

## 2019-07-30 DIAGNOSIS — I739 Peripheral vascular disease, unspecified: Secondary | ICD-10-CM | POA: Insufficient documentation

## 2019-07-30 HISTORY — DX: Peripheral vascular disease, unspecified: I73.9

## 2019-08-01 ENCOUNTER — Other Ambulatory Visit: Payer: Self-pay

## 2019-08-01 ENCOUNTER — Ambulatory Visit (INDEPENDENT_AMBULATORY_CARE_PROVIDER_SITE_OTHER): Payer: Medicare HMO | Admitting: Cardiology

## 2019-08-01 ENCOUNTER — Encounter: Payer: Self-pay | Admitting: Cardiology

## 2019-08-01 VITALS — BP 138/80 | HR 77 | Ht 68.0 in | Wt 160.0 lb

## 2019-08-01 DIAGNOSIS — R0989 Other specified symptoms and signs involving the circulatory and respiratory systems: Secondary | ICD-10-CM

## 2019-08-01 DIAGNOSIS — I350 Nonrheumatic aortic (valve) stenosis: Secondary | ICD-10-CM | POA: Diagnosis not present

## 2019-08-01 DIAGNOSIS — I2510 Atherosclerotic heart disease of native coronary artery without angina pectoris: Secondary | ICD-10-CM | POA: Diagnosis not present

## 2019-08-01 DIAGNOSIS — I420 Dilated cardiomyopathy: Secondary | ICD-10-CM | POA: Diagnosis not present

## 2019-08-01 DIAGNOSIS — E785 Hyperlipidemia, unspecified: Secondary | ICD-10-CM | POA: Diagnosis not present

## 2019-08-01 NOTE — Patient Instructions (Signed)
Medication Instructions:  Your physician recommends that you continue on your current medications as directed. Please refer to the Current Medication list given to you today.  *If you need a refill on your cardiac medications before your next appointment, please call your pharmacy*   Lab Work: None If you have labs (blood work) drawn today and your tests are completely normal, you will receive your results only by: . MyChart Message (if you have MyChart) OR . A paper copy in the mail If you have any lab test that is abnormal or we need to change your treatment, we will call you to review the results.   Testing/Procedures: Your physician has requested that you have an echocardiogram. Echocardiography is a painless test that uses sound waves to create images of your heart. It provides your doctor with information about the size and shape of your heart and how well your heart's chambers and valves are working. This procedure takes approximately one hour. There are no restrictions for this procedure.     Follow-Up: At [facility], you and your health needs are our priority.  As part of our continuing mission to provide you with exceptional heart care, we have created designated Provider Care Teams.  These Care Teams include your primary Cardiologist (physician) and Advanced Practice Providers (APPs -  Physician Assistants and Nurse Practitioners) who all work together to provide you with the care you need, when you need it.  We recommend signing up for the patient portal called "MyChart".  Sign up information is provided on this After Visit Summary.  MyChart is used to connect with patients for Virtual Visits (Telemedicine).  Patients are able to view lab/test results, encounter notes, upcoming appointments, etc.  Non-urgent messages can be sent to your provider as well.   To learn more about what you can do with MyChart, go to [email or web address].    Your next appointment:   1  month(s)  The format for your next appointment:   In Person  Provider:   Robert Krasowski, MD   Other Instructions   Echocardiogram An echocardiogram is a procedure that uses painless sound waves (ultrasound) to produce an image of the heart. Images from an echocardiogram can provide important information about:  Signs of coronary artery disease (CAD).  Aneurysm detection. An aneurysm is a weak or damaged part of an artery wall that bulges out from the normal force of blood pumping through the body.  Heart size and shape. Changes in the size or shape of the heart can be associated with certain conditions, including heart failure, aneurysm, and CAD.  Heart muscle function.  Heart valve function.  Signs of a past heart attack.  Fluid buildup around the heart.  Thickening of the heart muscle.  A tumor or infectious growth around the heart valves. Tell a health care provider about:  Any allergies you have.  All medicines you are taking, including vitamins, herbs, eye drops, creams, and over-the-counter medicines.  Any blood disorders you have.  Any surgeries you have had.  Any medical conditions you have.  Whether you are pregnant or may be pregnant. What are the risks? Generally, this is a safe procedure. However, problems may occur, including:  Allergic reaction to dye (contrast) that may be used during the procedure. What happens before the procedure? No specific preparation is needed. You may eat and drink normally. What happens during the procedure?   An IV tube may be inserted into one of your veins.  You may receive   contrast through this tube. A contrast is an injection that improves the quality of the pictures from your heart.  A gel will be applied to your chest.  A wand-like tool (transducer) will be moved over your chest. The gel will help to transmit the sound waves from the transducer.  The sound waves will harmlessly bounce off of your heart to  allow the heart images to be captured in real-time motion. The images will be recorded on a computer. The procedure may vary among health care providers and hospitals. What happens after the procedure?  You may return to your normal, everyday life, including diet, activities, and medicines, unless your health care provider tells you not to do that. Summary  An echocardiogram is a procedure that uses painless sound waves (ultrasound) to produce an image of the heart.  Images from an echocardiogram can provide important information about the size and shape of your heart, heart muscle function, heart valve function, and fluid buildup around your heart.  You do not need to do anything to prepare before this procedure. You may eat and drink normally.  After the echocardiogram is completed, you may return to your normal, everyday life, unless your health care provider tells you not to do that. This information is not intended to replace advice given to you by your health care provider. Make sure you discuss any questions you have with your health care provider. Document Revised: 06/23/2018 Document Reviewed: 04/04/2016 Elsevier Patient Education  2020 Elsevier Inc.   

## 2019-08-01 NOTE — Progress Notes (Signed)
Cardiology Office Note:    Date:  08/01/2019   ID:  Ernest Frederick, DOB 03-21-1950, MRN [id number]  PCP:  Algis Greenhouse, MD  Cardiologist:  Jenne Campus, MD    Referring MD: Algis Greenhouse, MD   Chief Complaint  Patient presents with  . Follow-up    1 YR FU     History of Present Illness:    Ernest Frederick is a 69 y.o. male with past medical history significant for cardiomyopathy, last echocardiogram showed preserved left ventricle ejection fraction.  He was also find to have aortic stenosis last estimation in May 2019 which was 2 years ago showing moderate stenosis.  Valve area at that time was about 1 cm.  He disappeared from follow-up and comes today for regular checkup.  He complained of being tired exhausted easily getting short of breath and quiver in the chest what he described.  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  Denies having dizziness or passing out, no palpitations.  Past Medical History:  Diagnosis Date  . Hyperlipidemia   . Hypertension   . Pulmonary embolism ([place])   . Thyroid nodule     Past Surgical History:  Procedure Laterality Date  . CERVICAL FUSION    . SPLENECTOMY      Current Medications: Current Meds  Medication Sig  . donepezil (ARICEPT) 10 MG tablet Take 10 mg by mouth daily.  Marland Kitchen lisinopril (ZESTRIL) 10 MG tablet Take 1 tablet (10 mg total) by mouth daily.  . Multiple Vitamin (MULTIVITAMIN) capsule Take 1 capsule by mouth daily.  . nitroGLYCERIN (NITROSTAT) 0.4 MG SL tablet Place 1 tablet (0.4 mg total) under the tongue as needed for chest pain.  Marland Kitchen omeprazole (PRILOSEC) 20 MG capsule Take 20 mg by mouth daily.  . pravastatin (PRAVACHOL) 20 MG tablet Take 1 tablet (20 mg total) by mouth daily. Please schedule an overdue appointment with Dr. Agustin Cree for further refills 2ND ATTEMPT !  . spironolactone (ALDACTONE) 25 MG tablet Take 1 tablet  (25 mg total) by mouth daily.  Marland Kitchen venlafaxine XR (EFFEXOR-XR) 75 MG 24 hr capsule Take 75 mg by mouth daily.     Allergies:   Hydrocodone-acetaminophen and Simvastatin   Social History   Socioeconomic History  . Marital status: Married    Spouse name: Not on file  . Number of children: Not on file  . Years of education: Not on file  . Highest education level: Not on file  Occupational History  . Not on file  Tobacco Use  . Smoking status: Former Research scientist (life sciences)  . Smokeless tobacco: Former Network engineer and Sexual Activity  . Alcohol use: No  . Drug use: No  . Sexual activity: Not on file  Other Topics Concern  . Not on file  Social History Narrative  . Not on file   Social Determinants of Health   Financial Resource Strain:   . Difficulty of Paying Living Expenses:   Food Insecurity:   . Worried About Charity fundraiser in the Last Year:   . Arboriculturist in the Last Year:   Transportation Needs:   . Film/video editor (Medical):   Marland Kitchen Lack of Transportation (Non-Medical):   Physical Activity:   . Days of Exercise per Week:   . Minutes of Exercise per Session:   Stress:   . Feeling of Stress :  Social Connections:   . Frequency of Communication with Friends and Family:   . Frequency of Social Gatherings with Friends and Family:   . Attends Religious Services:   . Active Member of Clubs or Organizations:   . Attends Archivist Meetings:   Marland Kitchen Marital Status:      Family History: The patient's family history includes Heart murmur in his mother; Prostate cancer in his father. ROS:   Please see the history of present illness.    All 14 point review of systems negative except as described per history of present illness  EKGs/Labs/Other Studies Reviewed:    EKG showed normal sinus rhythm voltage criteria for LVH, no acute ST segment changes  Recent Labs: No results found for requested labs within last 8760 hours.  Recent Lipid Panel No results found  for: CHOL, TRIG, HDL, CHOLHDL, VLDL, LDLCALC, LDLDIRECT  Physical Exam:    VS:  BP 138/80   Pulse 77   Ht 5\' 8"  (1.727 m)   Wt 160 lb (72.6 kg)   SpO2 90%   BMI 24.33 kg/m     Wt Readings from Last 3 Encounters:  08/01/19 160 lb (72.6 kg)  09/23/17 164 lb (74.4 kg)  06/17/17 171 lb 12.8 oz (77.9 kg)     GEN:  Well nourished, well developed in no acute distress HEENT: Normal NECK: No JVD; No carotid bruits LYMPHATICS: No lymphadenopathy CARDIAC: RRR, systolic ejection murmur grade 2 through 3/6 best heard right upper portion of the sternum mid to late peaking, S2 is still present, there is also early diastolic murmur grade 1/6 best heard at the left border of the sternum without radiation, no rubs, no gallops RESPIRATORY:  Clear to auscultation without rales, wheezing or rhonchi  ABDOMEN: Soft, non-tender, non-distended MUSCULOSKELETAL:  No edema; No deformity  SKIN: Warm and dry LOWER EXTREMITIES: no swelling NEUROLOGIC:  Alert and oriented x 3 PSYCHIATRIC:  Normal affect   ASSESSMENT:    1. Nonrheumatic aortic valve stenosis   2. Coronary artery disease involving native coronary artery of native heart without angina pectoris   3. Dilated cardiomyopathy ([place])   4. Dyslipidemia   5. Bilateral carotid bruits    PLAN:    In order of problems listed above:  1. Nonrheumatic arctic valve stenosis I really worried that this valve is become significant.  Still I could hear S2 on the physical examination but murmur appears to be mid to late peaking.  We will schedule him to have an echocardiogram as quickly as possible.  Asked not to exercise and push himself heart until we do echocardiogram. 2. History of coronary artery disease if he truly does have significant aortic stenosis cardiac catheterization will be done. 3. History of dilated cardiomyopathy last echocardiogram from 2 years ago showing preserved left ventricle ejection fraction, he is on lisinopril 10 mg daily as well  as carvedilol we will not touch this medication today until have better understanding what is going with his aortic valve.  He is also on Aldactone I will check Chem-7 today. 4. Dyslipidemia he is only on pravastatin we will check his fasting lipid profile. 5. Bilateral carotic bruit I suspect this is a transmitted murmur from his aortic stenosis.   Medication Adjustments/Labs and Tests Ordered: Current medicines are reviewed at length with the patient today.  Concerns regarding medicines are outlined above.  No orders of the defined types were placed in this encounter.  Medication changes: No orders of the defined types were  placed in this encounter.   Signed, Park Liter, MD, [facility] 08/01/2019 10:44 AM    [place]

## 2019-08-04 ENCOUNTER — Other Ambulatory Visit: Payer: Self-pay

## 2019-08-04 ENCOUNTER — Ambulatory Visit (HOSPITAL_BASED_OUTPATIENT_CLINIC_OR_DEPARTMENT_OTHER)
Admission: RE | Admit: 2019-08-04 | Discharge: 2019-08-04 | Disposition: A | Discharge status: Home / Self Care | Payer: Medicare HMO | Source: Ambulatory Visit | Attending: Cardiology | Admitting: Cardiology

## 2019-08-04 DIAGNOSIS — I350 Nonrheumatic aortic (valve) stenosis: Secondary | ICD-10-CM | POA: Insufficient documentation

## 2019-08-13 ENCOUNTER — Other Ambulatory Visit: Payer: Self-pay | Admitting: Cardiology

## 2019-08-13 DIAGNOSIS — I10 Essential (primary) hypertension: Secondary | ICD-10-CM

## 2019-08-15 ENCOUNTER — Telehealth: Payer: Self-pay | Admitting: Cardiology

## 2019-08-15 NOTE — Telephone Encounter (Signed)
Patient's wife is calling to inquire about whether or not the patient should be taking carvedilol (COREG) 3.125 MG tablet. Please advise.

## 2019-08-15 NOTE — Telephone Encounter (Signed)
Yes, he should be taking Coreg at this dose

## 2019-08-15 NOTE — Telephone Encounter (Signed)
Wife of the patient was following up on the call she made this morning

## 2019-08-16 ENCOUNTER — Other Ambulatory Visit: Payer: Medicare HMO

## 2019-08-16 MED ORDER — CARVEDILOL 3.125 MG PO TABS: 3.1250 mg | ORAL_TABLET | Freq: Two times a day (BID) | ORAL | 11 refills | Status: DC

## 2019-08-16 NOTE — Telephone Encounter (Signed)
Spoke with patients wife just now and let her know that he should be taking the Coreg 3.125 mg tablet. She verbalizes understanding and states that they need another prescription for this. I sent the prescription in at this time. No other issues or concerns were noted.    Encouraged for patient to call back with any questions or concerns.

## 2019-08-17 ENCOUNTER — Telehealth: Payer: Self-pay | Admitting: Emergency Medicine

## 2019-08-17 ENCOUNTER — Telehealth: Payer: Self-pay | Admitting: Cardiology

## 2019-08-17 NOTE — Telephone Encounter (Signed)
Called patient and spoke to his son per dpr advised him of the patient's echo results and confirmed his upcoming appointment with him. During the call he asked if patient can still do daily routine and ride riding lawn mower and do yard work until his next appointment to discuss echo results. Will consult with Dr. Agustin Cree.

## 2019-08-17 NOTE — Telephone Encounter (Signed)
Opened in error

## 2019-08-17 NOTE — Telephone Encounter (Signed)
Patient's son returning Hayley's call in regards to echo results.

## 2019-08-18 NOTE — Telephone Encounter (Signed)
Should not do heavy work, Medical illustrator should be ok

## 2019-08-18 NOTE — Telephone Encounter (Signed)
Called and spoke to patient's son per dpr advised him that Dr. Agustin Cree states the patient can ride on the riding lawn mower however any heavy for strenuous work is not advised right now. He verbally understood no further questions.

## 2019-09-04 ENCOUNTER — Encounter: Payer: Self-pay | Admitting: Cardiology

## 2019-09-04 ENCOUNTER — Ambulatory Visit (INDEPENDENT_AMBULATORY_CARE_PROVIDER_SITE_OTHER): Payer: Medicare HMO | Admitting: Cardiology

## 2019-09-04 ENCOUNTER — Other Ambulatory Visit: Payer: Self-pay

## 2019-09-04 VITALS — BP 111/62 | HR 83 | Ht 68.0 in | Wt 154.4 lb

## 2019-09-04 DIAGNOSIS — I10 Essential (primary) hypertension: Secondary | ICD-10-CM | POA: Diagnosis not present

## 2019-09-04 DIAGNOSIS — I420 Dilated cardiomyopathy: Secondary | ICD-10-CM | POA: Diagnosis not present

## 2019-09-04 DIAGNOSIS — I2510 Atherosclerotic heart disease of native coronary artery without angina pectoris: Secondary | ICD-10-CM

## 2019-09-04 DIAGNOSIS — I350 Nonrheumatic aortic (valve) stenosis: Secondary | ICD-10-CM

## 2019-09-04 NOTE — Patient Instructions (Signed)

## 2019-09-04 NOTE — Progress Notes (Signed)
Cardiology Office Note:    Date:  09/04/2019   ID:  Ernest Frederick., DOB Aug 27, 1950, MRN [id number]  PCP:  Ernest Greenhouse, MD  Cardiologist:  Ernest Campus, MD    Referring MD: Ernest Greenhouse, MD   Chief Complaint  Patient presents with  . Follow-up  Follow-up aortic stenosis  History of Present Illness:    Ernest Frederick. is a 69 y.o. male complex past medical history which includes some mental impairment, aortic stenosis, history of Hodgkin's lymphoma, benign essential hypertension, cardiomyopathy with normalization, carotic bruit.  Comes today to my office discuss results of his echocardiogram.  Overall it is very difficult conversation with him but what I can get out of him is the fact that he does not have any exertional shortness of breath, there is no chest pain tightness squeezing pressure burning chest there is no dizziness or passing out.  He described to be sometimes dizzy when he gets up very quickly but overall seems to be doing well.  I did review echocardiogram with him which showed aortic stenosis being moderate to severe.  Past Medical History:  Diagnosis Date  . Hyperlipidemia   . Hypertension   . Pulmonary embolism ([place])   . Thyroid nodule     Past Surgical History:  Procedure Laterality Date  . CERVICAL FUSION    . SPLENECTOMY      Current Medications: Current Meds  Medication Sig  . carvedilol (COREG) 3.125 MG tablet Take 1 tablet (3.125 mg total) by mouth 2 (two) times daily.  Marland Kitchen donepezil (ARICEPT) 10 MG tablet Take 10 mg by mouth daily.  Marland Kitchen lisinopril (ZESTRIL) 10 MG tablet TAKE 1 TABLET BY MOUTH EVERY DAY  . Multiple Vitamin (MULTIVITAMIN) capsule Take 1 capsule by mouth daily.  . nitroGLYCERIN (NITROSTAT) 0.4 MG SL tablet Place 1 tablet (0.4 mg total) under the tongue as needed for chest pain.  Marland Kitchen omeprazole (PRILOSEC) 20 MG capsule Take 20 mg by mouth daily.  . pravastatin (PRAVACHOL) 20 MG tablet Take 1 tablet (20 mg total) by mouth  daily. Please schedule an overdue appointment with Dr. Agustin Cree for further refills 2ND ATTEMPT !  . spironolactone (ALDACTONE) 25 MG tablet TAKE 1 TABLET BY MOUTH EVERY DAY  . venlafaxine XR (EFFEXOR-XR) 75 MG 24 hr capsule Take 75 mg by mouth daily.     Allergies:   Hydrocodone-acetaminophen and Simvastatin   Social History   Socioeconomic History  . Marital status: Married    Spouse name: Not on file  . Number of children: Not on file  . Years of education: Not on file  . Highest education level: Not on file  Occupational History  . Not on file  Tobacco Use  . Smoking status: Former Research scientist (life sciences)  . Smokeless tobacco: Former Network engineer  . Vaping Use: Never used  Substance and Sexual Activity  . Alcohol use: No  . Drug use: No  . Sexual activity: Not on file  Other Topics Concern  . Not on file  Social History Narrative  . Not on file   Social Determinants of Health   Financial Resource Strain:   . Difficulty of Paying Living Expenses:   Food Insecurity:   . Worried About Charity fundraiser in the Last Year:   . Arboriculturist in the Last Year:   Transportation Needs:   . Film/video editor (Medical):   Marland Kitchen Lack of Transportation (Non-Medical):   Physical Activity:   .  Days of Exercise per Week:   . Minutes of Exercise per Session:   Stress:   . Feeling of Stress :   Social Connections:   . Frequency of Communication with Friends and Family:   . Frequency of Social Gatherings with Friends and Family:   . Attends Religious Services:   . Active Member of Clubs or Organizations:   . Attends Archivist Meetings:   Marland Kitchen Marital Status:      Family History: The patient's family history includes Heart murmur in his mother; Prostate cancer in his father. ROS:   Please see the history of present illness.    All 14 point review of systems negative except as described per history of present illness  EKGs/Labs/Other Studies Reviewed:      Recent  Labs: No results found for requested labs within last 8760 hours.  Recent Lipid Panel No results found for: CHOL, TRIG, HDL, CHOLHDL, VLDL, LDLCALC, LDLDIRECT  Physical Exam:    VS:  BP 111/62 (BP Location: Left Arm, Patient Position: Sitting, Cuff Size: Normal)   Pulse 83   Ht 5\' 8"  (1.727 m)   Wt 154 lb 6.4 oz (70 kg)   SpO2 96%   BMI 23.48 kg/m     Wt Readings from Last 3 Encounters:  09/04/19 154 lb 6.4 oz (70 kg)  08/01/19 160 lb (72.6 kg)  09/23/17 164 lb (74.4 kg)     GEN:  Well nourished, well developed in no acute distress HEENT: Normal NECK: No JVD; No carotid bruits LYMPHATICS: No lymphadenopathy CARDIAC: RRR, systolic ejection murmur late peaking with S2 barely auscultable, no rubs, no gallops RESPIRATORY:  Clear to auscultation without rales, wheezing or rhonchi  ABDOMEN: Soft, non-tender, non-distended MUSCULOSKELETAL:  No edema; No deformity  SKIN: Warm and dry LOWER EXTREMITIES: no swelling NEUROLOGIC:  Alert and oriented x 3 PSYCHIATRIC:  Normal affect   ASSESSMENT:    1. Nonrheumatic aortic valve stenosis   2. Benign hypertension   3. Dilated cardiomyopathy ([place])   4. Coronary artery disease involving native coronary artery of native heart without angina pectoris    PLAN:    In order of problems listed above:  1. Nonrheumatic aortic valve stenosis: Worsening, moderate to severe.  He does not have any classical symptoms of severe aortic stenosis.  I told him that he develop shortness of breath chest pain or dizziness he need to let me know.  I did review his echocardiogram very carefully only one parameter suggests severe arctic stenosis which is dimensionless index being 0.24.  Otherwise area is more than 1, creatinine is only 30 mmHg.  I will see him back in my office in about 3 months to discuss this issue and I will see him for echocardiogram in about 5 months. 2. Benign essential hypertension blood pressure well controlled continue present  management. 3. Dilated cardiomyopathy: Last echocardiogram showed preserved left ventricle ejection fraction. 4. Coronary artery disease: Denies having any symptoms.   Medication Adjustments/Labs and Tests Ordered: Current medicines are reviewed at length with the patient today.  Concerns regarding medicines are outlined above.  No orders of the defined types were placed in this encounter.  Medication changes: No orders of the defined types were placed in this encounter.   Signed, Park Liter, MD, [facility] 09/04/2019 3:10 PM    [place]

## 2019-10-06 ENCOUNTER — Other Ambulatory Visit: Payer: Self-pay | Admitting: Cardiology

## 2019-12-13 ENCOUNTER — Ambulatory Visit: Payer: Medicare HMO | Admitting: Cardiology

## 2020-02-07 ENCOUNTER — Other Ambulatory Visit: Payer: Self-pay

## 2020-02-07 DIAGNOSIS — I10 Essential (primary) hypertension: Secondary | ICD-10-CM | POA: Insufficient documentation

## 2020-02-07 DIAGNOSIS — E785 Hyperlipidemia, unspecified: Secondary | ICD-10-CM | POA: Insufficient documentation

## 2020-02-07 DIAGNOSIS — I2699 Other pulmonary embolism without acute cor pulmonale: Secondary | ICD-10-CM | POA: Insufficient documentation

## 2020-02-13 ENCOUNTER — Ambulatory Visit: Payer: Medicare HMO | Admitting: Cardiology

## 2020-02-21 ENCOUNTER — Encounter: Payer: Self-pay | Admitting: Cardiology

## 2020-02-21 ENCOUNTER — Other Ambulatory Visit: Payer: Self-pay

## 2020-02-21 ENCOUNTER — Ambulatory Visit (INDEPENDENT_AMBULATORY_CARE_PROVIDER_SITE_OTHER): Payer: Medicare HMO | Admitting: Cardiology

## 2020-02-21 VITALS — BP 150/72 | HR 100 | Ht 69.0 in | Wt 157.0 lb

## 2020-02-21 DIAGNOSIS — I420 Dilated cardiomyopathy: Secondary | ICD-10-CM

## 2020-02-21 DIAGNOSIS — G301 Alzheimer's disease with late onset: Secondary | ICD-10-CM | POA: Diagnosis not present

## 2020-02-21 DIAGNOSIS — I350 Nonrheumatic aortic (valve) stenosis: Secondary | ICD-10-CM | POA: Diagnosis not present

## 2020-02-21 DIAGNOSIS — I739 Peripheral vascular disease, unspecified: Secondary | ICD-10-CM | POA: Diagnosis not present

## 2020-02-21 DIAGNOSIS — F0280 Dementia in other diseases classified elsewhere without behavioral disturbance: Secondary | ICD-10-CM

## 2020-02-21 DIAGNOSIS — R0989 Other specified symptoms and signs involving the circulatory and respiratory systems: Secondary | ICD-10-CM

## 2020-02-21 NOTE — Addendum Note (Signed)
Addended by: Jerl Santos R on: 02/21/2020 11:12 AM   Modules accepted: Orders

## 2020-02-21 NOTE — Patient Instructions (Signed)
Medication Instructions:  Your physician recommends that you continue on your current medications as directed. Please refer to the Current Medication list given to you today.  *If you need a refill on your cardiac medications before your next appointment, please call your pharmacy*   Lab Work: NONE If you have labs (blood work) drawn today and your tests are completely normal, you will receive your results only by: Marland Kitchen MyChart Message (if you have MyChart) OR . A paper copy in the mail If you have any lab test that is abnormal or we need to change your treatment, we will call you to review the results.   Testing/Procedures: Your physician has requested that you have an echocardiogram. Echocardiography is a painless test that uses sound waves to create images of your heart. It provides your doctor with information about the size and shape of your heart and how well your heart's chambers and valves are working. This procedure takes approximately one hour. There are no restrictions for this procedure.     Follow-Up: At [facility], you and your health needs are our priority.  As part of our continuing mission to provide you with exceptional heart care, we have created designated Provider Care Teams.  These Care Teams include your primary Cardiologist (physician) and Advanced Practice Providers (APPs -  Physician Assistants and Nurse Practitioners) who all work together to provide you with the care you need, when you need it.  We recommend signing up for the patient portal called "MyChart".  Sign up information is provided on this After Visit Summary.  MyChart is used to connect with patients for Virtual Visits (Telemedicine).  Patients are able to view lab/test results, encounter notes, upcoming appointments, etc.  Non-urgent messages can be sent to your provider as well.   To learn more about what you can do with MyChart, go to [email or web address].    Your next appointment:   3  month(s)  The format for your next appointment:   In Person  Provider:   Jenne Campus, MD   Other Instructions  Echocardiogram An echocardiogram is a procedure that uses painless sound waves (ultrasound) to produce an image of the heart. Images from an echocardiogram can provide important information about:  Signs of coronary artery disease (CAD).  Aneurysm detection. An aneurysm is a weak or damaged part of an artery wall that bulges out from the normal force of blood pumping through the body.  Heart size and shape. Changes in the size or shape of the heart can be associated with certain conditions, including heart failure, aneurysm, and CAD.  Heart muscle function.  Heart valve function.  Signs of a past heart attack.  Fluid buildup around the heart.  Thickening of the heart muscle.  A tumor or infectious growth around the heart valves. Tell a health care provider about:  Any allergies you have.  All medicines you are taking, including vitamins, herbs, eye drops, creams, and over-the-counter medicines.  Any blood disorders you have.  Any surgeries you have had.  Any medical conditions you have.  Whether you are pregnant or may be pregnant. What are the risks? Generally, this is a safe procedure. However, problems may occur, including:  Allergic reaction to dye (contrast) that may be used during the procedure. What happens before the procedure? No specific preparation is needed. You may eat and drink normally. What happens during the procedure?   An IV tube may be inserted into one of your veins.  You may receive contrast  through this tube. A contrast is an injection that improves the quality of the pictures from your heart.  A gel will be applied to your chest.  A wand-like tool (transducer) will be moved over your chest. The gel will help to transmit the sound waves from the transducer.  The sound waves will harmlessly bounce off of your heart to  allow the heart images to be captured in real-time motion. The images will be recorded on a computer. The procedure may vary among health care providers and hospitals. What happens after the procedure?  You may return to your normal, everyday life, including diet, activities, and medicines, unless your health care provider tells you not to do that. Summary  An echocardiogram is a procedure that uses painless sound waves (ultrasound) to produce an image of the heart.  Images from an echocardiogram can provide important information about the size and shape of your heart, heart muscle function, heart valve function, and fluid buildup around your heart.  You do not need to do anything to prepare before this procedure. You may eat and drink normally.  After the echocardiogram is completed, you may return to your normal, everyday life, unless your health care provider tells you not to do that. This information is not intended to replace advice given to you by your health care provider. Make sure you discuss any questions you have with your health care provider. Document Revised: 06/23/2018 Document Reviewed: 04/04/2016 Elsevier Patient Education  [place].

## 2020-02-21 NOTE — Progress Notes (Signed)
Cardiology Office Note:    Date:  02/21/2020   ID:  Ernest Poche., DOB 02-16-1951, MRN [id number]  PCP:  Algis Greenhouse, MD  Cardiologist:  Jenne Campus, MD    Referring MD: Algis Greenhouse, MD   Chief Complaint  Patient presents with  . Follow-up  I am doing fine  History of Present Illness:    Ernest Clemons. is a 69 y.o. male with past medical history significant for remote cardiomyopathy, remote coronary artery disease, aortic stenosis which appears to be significant now.  Comes today 2 months of follow-up.  He also does have some dementia and conversation with him is somewhat difficult however he tells me he does not have any shortness of breath he does have any chest pain he does not have any palpitations dizziness.  He said he is retired but he still works some in the garden.  Last time I see him he was talking about his aortic stenosis which started getting significant the plan was to repeat echocardiogram in 6 months and this is the time to do that.  Past Medical History:  Diagnosis Date  . Anemia, iron deficiency 11/10/2015  . Aortic stenosis 09/26/2015   Overview:  2017: ECHO moderate with LVH, EF 55%  . Benign hypertension 11/10/2015  . Cardiomyopathy ([place]) 09/12/2015  . Carotid bruit 09/12/2015  . Colonoscopy refused 02/25/2017   2018: due to AS  . Coronary artery disease 11/10/2015  . Dyslipidemia 09/12/2015  . Gastroesophageal reflux disease without esophagitis 05/12/2015  . Hodgkin lymphoma ([place]) 11/10/2015  . Hyperlipidemia   . Hypertension   . Late onset Alzheimer's disease without behavioral disturbance ([place]) 05/12/2015  . Peripheral vascular disease ([place]) 07/30/2019   Formatting of this note might be different from the original. 2021: ABI screening, right 1.08, left 0.76  . Pulmonary embolism ([place])   . Thyroid nodule   . Wellness examination 04/07/2017   2018: colonoscopy declined, risk normal    Past Surgical History:  Procedure Laterality Date  .  CERVICAL FUSION    . SPLENECTOMY      Current Medications: Current Meds  Medication Sig  . carvedilol (COREG) 3.125 MG tablet Take 1 tablet (3.125 mg total) by mouth 2 (two) times daily.  Marland Kitchen donepezil (ARICEPT) 10 MG tablet Take 10 mg by mouth daily.  Marland Kitchen lisinopril (ZESTRIL) 10 MG tablet TAKE 1 TABLET BY MOUTH EVERY DAY  . Multiple Vitamin (MULTIVITAMIN) capsule Take 1 capsule by mouth daily.  . nitroGLYCERIN (NITROSTAT) 0.4 MG SL tablet Place 1 tablet (0.4 mg total) under the tongue as needed for chest pain.  Marland Kitchen omeprazole (PRILOSEC) 40 MG capsule Take 1 capsule by mouth daily.  . pravastatin (PRAVACHOL) 20 MG tablet Take 1 tablet (20 mg total) by mouth daily.  . rosuvastatin (CRESTOR) 20 MG tablet Take 20 mg by mouth daily.  Marland Kitchen spironolactone (ALDACTONE) 25 MG tablet TAKE 1 TABLET BY MOUTH EVERY DAY  . venlafaxine XR (EFFEXOR-XR) 75 MG 24 hr capsule Take 75 mg by mouth daily.     Allergies:   Hydrocodone-acetaminophen and Simvastatin   Social History   Socioeconomic History  . Marital status: Married    Spouse name: Not on file  . Number of children: Not on file  . Years of education: Not on file  . Highest education level: Not on file  Occupational History  . Not on file  Tobacco Use  . Smoking status: Former Research scientist (life sciences)  . Smokeless tobacco: Former Systems developer  Vaping Use  . Vaping Use: Never used  Substance and Sexual Activity  . Alcohol use: No  . Drug use: No  . Sexual activity: Not on file  Other Topics Concern  . Not on file  Social History Narrative  . Not on file   Social Determinants of Health   Financial Resource Strain:   . Difficulty of Paying Living Expenses: Not on file  Food Insecurity:   . Worried About Charity fundraiser in the Last Year: Not on file  . Ran Out of Food in the Last Year: Not on file  Transportation Needs:   . Lack of Transportation (Medical): Not on file  . Lack of Transportation (Non-Medical): Not on file  Physical Activity:   . Days of  Exercise per Week: Not on file  . Minutes of Exercise per Session: Not on file  Stress:   . Feeling of Stress : Not on file  Social Connections:   . Frequency of Communication with Friends and Family: Not on file  . Frequency of Social Gatherings with Friends and Family: Not on file  . Attends Religious Services: Not on file  . Active Member of Clubs or Organizations: Not on file  . Attends Archivist Meetings: Not on file  . Marital Status: Not on file     Family History: The patient's family history includes Heart murmur in his mother; Prostate cancer in his father. ROS:   Please see the history of present illness.    All 14 point review of systems negative except as described per history of present illness  EKGs/Labs/Other Studies Reviewed:      Recent Labs: No results found for requested labs within last 8760 hours.  Recent Lipid Panel No results found for: CHOL, TRIG, HDL, CHOLHDL, VLDL, LDLCALC, LDLDIRECT  Physical Exam:    VS:  BP (!) 150/72 (BP Location: Left Arm, Patient Position: Sitting)   Pulse 100   Ht 5\' 9"  (1.753 m)   Wt 157 lb (71.2 kg)   SpO2 98%   BMI 23.18 kg/m     Wt Readings from Last 3 Encounters:  02/21/20 157 lb (71.2 kg)  09/04/19 154 lb 6.4 oz (70 kg)  08/01/19 160 lb (72.6 kg)     GEN:  Well nourished, well developed in no acute distress HEENT: Normal NECK: No JVD; No carotid bruits LYMPHATICS: No lymphadenopathy CARDIAC: RRR, mid-to-late peaking systolic ejection murmur S2 is very soft.  Best heard right upper portion of the sternum, there is also systolic murmur heard at the left border of the sternum grade 3/6, no rubs, no gallops RESPIRATORY:  Clear to auscultation without rales, wheezing or rhonchi  ABDOMEN: Soft, non-tender, non-distended MUSCULOSKELETAL:  No edema; No deformity  SKIN: Warm and dry LOWER EXTREMITIES: no swelling NEUROLOGIC:  Alert and oriented x 3 PSYCHIATRIC:  Normal affect   ASSESSMENT:    1.  Nonrheumatic aortic valve stenosis   2. Dilated cardiomyopathy ([place])   3. Peripheral vascular disease ([place])   4. Late onset Alzheimer's disease without behavioral disturbance ([place])   5. Bilateral carotid bruits    PLAN:    In order of problems listed above:  1. Aortic stenosis echocardiogram was repeated to look 1 more time at his aortic valve. 2. History of dilated cardiomyopathy, left ventricle ejection fraction preserved based on last echocardiogram. 3. Peripheral vascular disease: We will schedule him to have carotic ultrasound. 4. Dementia noted. 5. Carotic bruit probably transmitted murmur from aortic stenosis  however carotic ultrasounds will be done.   Medication Adjustments/Labs and Tests Ordered: Current medicines are reviewed at length with the patient today.  Concerns regarding medicines are outlined above.  No orders of the defined types were placed in this encounter.  Medication changes: No orders of the defined types were placed in this encounter.   Signed, Park Liter, MD, [facility] 02/21/2020 10:57 AM    [place]

## 2020-03-22 ENCOUNTER — Other Ambulatory Visit: Payer: Self-pay

## 2020-03-22 ENCOUNTER — Ambulatory Visit (INDEPENDENT_AMBULATORY_CARE_PROVIDER_SITE_OTHER): Payer: Medicare HMO

## 2020-03-22 DIAGNOSIS — I739 Peripheral vascular disease, unspecified: Secondary | ICD-10-CM

## 2020-03-22 DIAGNOSIS — I350 Nonrheumatic aortic (valve) stenosis: Secondary | ICD-10-CM | POA: Diagnosis not present

## 2020-03-22 DIAGNOSIS — G301 Alzheimer's disease with late onset: Secondary | ICD-10-CM | POA: Diagnosis not present

## 2020-03-22 DIAGNOSIS — I420 Dilated cardiomyopathy: Secondary | ICD-10-CM | POA: Diagnosis not present

## 2020-03-22 DIAGNOSIS — R0989 Other specified symptoms and signs involving the circulatory and respiratory systems: Secondary | ICD-10-CM

## 2020-03-22 DIAGNOSIS — F0280 Dementia in other diseases classified elsewhere without behavioral disturbance: Secondary | ICD-10-CM

## 2020-03-22 LAB — ECHOCARDIOGRAM COMPLETE
AR max vel: 0.68 cm2
AV Area VTI: 0.67 cm2
AV Area mean vel: 0.65 cm2
AV Mean grad: 30.5 mmHg
AV Peak grad: 54.6 mmHg
Ao pk vel: 3.7 m/s
Area-P 1/2: 5.27 cm2
Calc EF: 50.5 %
S' Lateral: 3.2 cm
Single Plane A2C EF: 47.3 %
Single Plane A4C EF: 53.3 %

## 2020-03-22 NOTE — Progress Notes (Signed)
Complete echocardiogram performed.  Jimmy Britton Bera RDCS, RVT  

## 2020-03-29 ENCOUNTER — Telehealth: Payer: Self-pay

## 2020-03-29 NOTE — Telephone Encounter (Signed)
-----   Message from Park Liter, MD sent at 03/25/2020 10:03 AM EST ----- Aortic valve appears to be significantly narrowed.  He did have a follow-up to discuss that

## 2020-03-29 NOTE — Telephone Encounter (Signed)
Left a message to return my call.

## 2020-04-04 ENCOUNTER — Telehealth: Payer: Self-pay | Admitting: Cardiology

## 2020-04-04 NOTE — Telephone Encounter (Signed)
That should be fine. 

## 2020-04-04 NOTE — Telephone Encounter (Signed)
Spoke with pts wife, [place] on [place]... and she verbalized understanding of his Echo results and I moved up his follow up from March 2022 to 04/18/20. Will forward to Dr. Agustin Cree to be sure that is not too far off for him to see the pt based on the Echo results.

## 2020-04-04 NOTE — Telephone Encounter (Signed)
Patient's wife returning call for echo results.

## 2020-04-18 ENCOUNTER — Ambulatory Visit: Payer: Medicare HMO | Admitting: Cardiology

## 2020-05-13 ENCOUNTER — Ambulatory Visit (INDEPENDENT_AMBULATORY_CARE_PROVIDER_SITE_OTHER): Payer: Medicare HMO | Admitting: Cardiology

## 2020-05-13 ENCOUNTER — Other Ambulatory Visit: Payer: Self-pay

## 2020-05-13 ENCOUNTER — Encounter: Payer: Self-pay | Admitting: Cardiology

## 2020-05-13 VITALS — BP 128/88 | HR 78 | Ht 69.0 in | Wt 159.0 lb

## 2020-05-13 DIAGNOSIS — I420 Dilated cardiomyopathy: Secondary | ICD-10-CM

## 2020-05-13 DIAGNOSIS — I10 Essential (primary) hypertension: Secondary | ICD-10-CM

## 2020-05-13 DIAGNOSIS — I350 Nonrheumatic aortic (valve) stenosis: Secondary | ICD-10-CM | POA: Diagnosis not present

## 2020-05-13 DIAGNOSIS — E785 Hyperlipidemia, unspecified: Secondary | ICD-10-CM

## 2020-05-13 NOTE — Progress Notes (Signed)
Cardiology Office Note:    Date:  05/13/2020   ID:  [name]., DOB 1950-11-30, MRN [id number]  PCP:  Algis Greenhouse, MD  Cardiologist:  Jenne Campus, MD    Referring MD: Algis Greenhouse, MD   Chief Complaint  Patient presents with  . Follow-up  I am doing fine  History of Present Illness:    Ernest Frederick. is a 70 y.o. male with past medical history significant for aortic stenosis, cardiomyopathy with latest estimation of left ventricle ejection fraction 50 to 55%, dementia, carotic bruit, benign essential hypertension. I evaluate him today to my office to talk about his aortic stenosis.  It appears to be severe low-flow low gradient aortic stenosis.  He reports to have some exertional shortness of breath, there is no chest pain tightness squeezing pressure burning chest, there is no dizziness no passing out. He is 70 years old but still trying to be active but did notice some shortness of breath.  Past Medical History:  Diagnosis Date  . Anemia, iron deficiency 11/10/2015  . Aortic stenosis 09/26/2015   Overview:  2017: ECHO moderate with LVH, EF 55%  . Benign hypertension 11/10/2015  . Cardiomyopathy ([place]) 09/12/2015  . Carotid bruit 09/12/2015  . Colonoscopy refused 02/25/2017   2018: due to AS  . Coronary artery disease 11/10/2015  . Dyslipidemia 09/12/2015  . Gastroesophageal reflux disease without esophagitis 05/12/2015  . Hodgkin lymphoma ([place]) 11/10/2015  . Hyperlipidemia   . Hypertension   . 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 Peripheral vascular disease ([place]) 07/30/2019   Formatting of this note might be different from the original. 2021: ABI screening, right 1.08, left 0.76  . Pulmonary embolism ([place])   . Thyroid nodule   . Wellness examination 04/07/2017   2018: colonoscopy declined, risk normal    Past Surgical History:  Procedure Laterality Date  . CERVICAL FUSION    . SPLENECTOMY      Current  Medications: Current Meds  Medication Sig  . carvedilol (COREG) 3.125 MG tablet Take 1 tablet (3.125 mg total) by mouth 2 (two) times daily.  Marland Kitchen donepezil (ARICEPT) 10 MG tablet Take 10 mg by mouth daily.  Marland Kitchen lisinopril (ZESTRIL) 10 MG tablet TAKE 1 TABLET BY MOUTH EVERY DAY  . Multiple Vitamin (MULTIVITAMIN) capsule Take 1 capsule by mouth daily.  . nitroGLYCERIN (NITROSTAT) 0.4 MG SL tablet Place 1 tablet (0.4 mg total) under the tongue as needed for chest pain.  Marland Kitchen omeprazole (PRILOSEC) 40 MG capsule Take 1 capsule by mouth daily.  . pravastatin (PRAVACHOL) 20 MG tablet Take 1 tablet (20 mg total) by mouth daily.  . rosuvastatin (CRESTOR) 20 MG tablet Take 20 mg by mouth daily.  Marland Kitchen spironolactone (ALDACTONE) 25 MG tablet TAKE 1 TABLET BY MOUTH EVERY DAY  . venlafaxine XR (EFFEXOR-XR) 75 MG 24 hr capsule Take 75 mg by mouth daily.     Allergies:   Hydrocodone-acetaminophen and Simvastatin   Social History   Socioeconomic History  . Marital status: Married    Spouse name: Not on file  . Number of children: Not on file  . Years of education: Not on file  . Highest education level: Not on file  Occupational History  . Not on file  Tobacco Use  . Smoking status: Former Research scientist (life sciences)  . Smokeless tobacco: Former Network engineer  . Vaping Use: Never used  Substance and Sexual Activity  . Alcohol use: No  .  Drug use: No  . Sexual activity: Not on file  Other Topics Concern  . Not on file  Social History Narrative  . Not on file   Social Determinants of Health   Financial Resource Strain: Not on file  Food Insecurity: Not on file  Transportation Needs: Not on file  Physical Activity: Not on file  Stress: Not on file  Social Connections: Not on file     Family History: The patient's family history includes Heart murmur in his mother; Prostate cancer in his father. ROS:   Please see the history of present illness.    All 14 point review of systems negative except as described per  history of present illness  EKGs/Labs/Other Studies Reviewed:      Recent Labs: No results found for requested labs within last 8760 hours.  Recent Lipid Panel No results found for: CHOL, TRIG, HDL, CHOLHDL, VLDL, LDLCALC, LDLDIRECT  Physical Exam:    VS:  BP 128/88 (BP Location: Left Arm, Patient Position: Sitting)   Pulse 78   Ht 5\' 9"  (1.753 m)   Wt 159 lb (72.1 kg)   SpO2 98%   BMI 23.48 kg/m     Wt Readings from Last 3 Encounters:  05/13/20 159 lb (72.1 kg)  02/21/20 157 lb (71.2 kg)  09/04/19 154 lb 6.4 oz (70 kg)     GEN:  Well nourished, well developed in no acute distress HEENT: Normal NECK: No JVD; No carotid bruits LYMPHATICS: No lymphadenopathy CARDIAC: RRR, systolic ejection murmur grade 2-3/6 best heard right upper portion of the sternum, no rubs, no gallops RESPIRATORY:  Clear to auscultation without rales, wheezing or rhonchi  ABDOMEN: Soft, non-tender, non-distended MUSCULOSKELETAL:  No edema; No deformity  SKIN: Warm and dry LOWER EXTREMITIES: no swelling NEUROLOGIC:  Alert and oriented x 3 PSYCHIATRIC:  Normal affect   ASSESSMENT:    1. Nonrheumatic aortic valve stenosis   2. Dyslipidemia   3. Benign hypertension   4. Dilated cardiomyopathy ([place])    PLAN:    In order of problems listed above:  1. Nonrheumatic aortic valve stenosis which appears to be low-flow low gradient severe aortic stenosis.  I had a long discussion with him today I explained to him what the problem is the best I can.  He does have some difficulty understanding whether he think he dropped the concept.  I told him that the way to act in this is either medical therapy which overall is not the most effective, TAVI, versus surgical valve replacement.  In my opinion subacute calvarium replacement will be out of the question because of his dementia which can get worse.  Overall however he is still functional.  We had a long discussion about this and I told him that I would laugh to  talk to his wife and his daughter.  I will schedule another meeting in about 3 to 4 weeks and hopefully will be able to talk to his daughter to his wife about details and then some decision will be made.  In the meantime ask him to let me know if he had chest pain tightness pressure burning chest dizziness or passing out shortness of breath. 2. Dyslipidemia: We will schedule him to have fasting lipid profile done. 3. Benign essential hypertension blood pressure well controlled continue present management. 4. History of cardiomyopathy he is on Zestril as well as Aldactone and Coreg which I will continue for now. 5. History of carotic bruit.  In the future we will  schedule him to have carotic ultrasound.  Overall it was a long and difficult visit.   Medication Adjustments/Labs and Tests Ordered: Current medicines are reviewed at length with the patient today.  Concerns regarding medicines are outlined above.  No orders of the defined types were placed in this encounter.  Medication changes: No orders of the defined types were placed in this encounter.   Signed, Park Liter, MD, [facility] 05/13/2020 10:58 AM    [place]

## 2020-05-13 NOTE — Patient Instructions (Signed)
Medication Instructions:  °Your physician recommends that you continue on your current medications as directed. Please refer to the Current Medication list given to you today. ° °*If you need a refill on your cardiac medications before your next appointment, please call your pharmacy* ° ° °Lab Work: °None °If you have labs (blood work) drawn today and your tests are completely normal, you will receive your results only by: °MyChart Message (if you have MyChart) OR °A paper copy in the mail °If you have any lab test that is abnormal or we need to change your treatment, we will call you to review the results. ° ° °Testing/Procedures: °None ° ° °Follow-Up: °At [facility], you and your health needs are our priority.  As part of our continuing mission to provide you with exceptional heart care, we have created designated Provider Care Teams.  These Care Teams include your primary Cardiologist (physician) and Advanced Practice Providers (APPs -  Physician Assistants and Nurse Practitioners) who all work together to provide you with the care you need, when you need it. ° °We recommend signing up for the patient portal called "MyChart".  Sign up information is provided on this After Visit Summary.  MyChart is used to connect with patients for Virtual Visits (Telemedicine).  Patients are able to view lab/test results, encounter notes, upcoming appointments, etc.  Non-urgent messages can be sent to your provider as well.   °To learn more about what you can do with MyChart, go to [email or web address].   ° °Your next appointment:   °3 week(s) ° °The format for your next appointment:   °In Person ° °Provider:   °Robert Krasowski, MD  ° ° °Other Instructions ° ° ° °

## 2020-05-17 ENCOUNTER — Other Ambulatory Visit: Payer: Self-pay | Admitting: Cardiology

## 2020-05-30 ENCOUNTER — Ambulatory Visit: Payer: Medicare HMO | Admitting: Cardiology

## 2020-06-04 ENCOUNTER — Other Ambulatory Visit: Payer: Self-pay | Admitting: Cardiology

## 2020-06-04 DIAGNOSIS — I10 Essential (primary) hypertension: Secondary | ICD-10-CM

## 2020-06-05 ENCOUNTER — Other Ambulatory Visit: Payer: Self-pay

## 2020-06-05 ENCOUNTER — Ambulatory Visit: Payer: Medicare HMO | Admitting: Cardiology

## 2020-06-05 ENCOUNTER — Encounter: Payer: Self-pay | Admitting: Cardiology

## 2020-06-05 VITALS — BP 130/80 | HR 75 | Ht 69.0 in | Wt 155.0 lb

## 2020-06-05 DIAGNOSIS — I2510 Atherosclerotic heart disease of native coronary artery without angina pectoris: Secondary | ICD-10-CM | POA: Diagnosis not present

## 2020-06-05 DIAGNOSIS — I420 Dilated cardiomyopathy: Secondary | ICD-10-CM | POA: Diagnosis not present

## 2020-06-05 DIAGNOSIS — G301 Alzheimer's disease with late onset: Secondary | ICD-10-CM

## 2020-06-05 DIAGNOSIS — I350 Nonrheumatic aortic (valve) stenosis: Secondary | ICD-10-CM | POA: Diagnosis not present

## 2020-06-05 DIAGNOSIS — F0280 Dementia in other diseases classified elsewhere without behavioral disturbance: Secondary | ICD-10-CM

## 2020-06-05 DIAGNOSIS — E785 Hyperlipidemia, unspecified: Secondary | ICD-10-CM

## 2020-06-05 NOTE — Progress Notes (Signed)
Cardiology Office Note:    Date:  06/05/2020   ID:  Ernest Poche., DOB 10/17/1950, MRN [id number]  PCP:  Algis Greenhouse, MD  Cardiologist:  Jenne Campus, MD    Referring MD: Algis Greenhouse, MD   No chief complaint on file. Am doing fine  History of Present Illness:    Ernest Frederick. is a 70 y.o. male with past medical history significant for significant aortic stenosis, cardiomyopathy with latest estimation of left ventricle ejection fraction 50 to 55%, benign essential hypertension, dyslipidemia, dementia. I spent 45 minutes last time talking to him about the problem with his aortic valve in my opinion this is severe arctic stenosis will low-flow low gradient need to be fixed we talked about open surgical interventions versus TAVI he promised me to think about it and he also promised that next time we need to do he will bring his family members so we can talk more details.  However he comes alone today at the very beginning he tells me does not want to have the valve fixed.  He said he will await.  Described to have some exertional shortness of breath but nothing dramatic, no dizziness no chest pain tightness squeezing pressure burning chest  Past Medical History:  Diagnosis Date  . Anemia, iron deficiency 11/10/2015  . Aortic stenosis 09/26/2015   Overview:  2017: ECHO moderate with LVH, EF 55%  . Benign hypertension 11/10/2015  . Cardiomyopathy ([place]) 09/12/2015  . Carotid bruit 09/12/2015  . Colonoscopy refused 02/25/2017   2018: due to AS  . Coronary artery disease 11/10/2015  . Dyslipidemia 09/12/2015  . Gastroesophageal reflux disease without esophagitis 05/12/2015  . Hodgkin lymphoma ([place]) 11/10/2015  . Hyperlipidemia   . Hypertension   . Late onset Alzheimer's disease without behavioral disturbance ([place]) 05/12/2015  . Peripheral vascular disease ([place]) 07/30/2019   Formatting of this note might be different from the original. 2021: ABI screening, right 1.08, left 0.76   . Pulmonary embolism ([place])   . Thyroid nodule   . Wellness examination 04/07/2017   2018: colonoscopy declined, risk normal    Past Surgical History:  Procedure Laterality Date  . CERVICAL FUSION    . SPLENECTOMY      Current Medications: Current Meds  Medication Sig  . carvedilol (COREG) 3.125 MG tablet Take 1 tablet (3.125 mg total) by mouth 2 (two) times daily.  Marland Kitchen donepezil (ARICEPT) 10 MG tablet Take 10 mg by mouth daily.  Marland Kitchen lisinopril (ZESTRIL) 10 MG tablet TAKE 1 TABLET BY MOUTH EVERY DAY  . Multiple Vitamin (MULTIVITAMIN) capsule Take 1 capsule by mouth daily.  . nitroGLYCERIN (NITROSTAT) 0.4 MG SL tablet Place 1 tablet (0.4 mg total) under the tongue as needed for chest pain.  Marland Kitchen omeprazole (PRILOSEC) 40 MG capsule Take 1 capsule by mouth daily.  . pravastatin (PRAVACHOL) 20 MG tablet Take 1 tablet (20 mg total) by mouth daily.  . rosuvastatin (CRESTOR) 20 MG tablet Take 20 mg by mouth daily.  Marland Kitchen spironolactone (ALDACTONE) 25 MG tablet TAKE 1 TABLET BY MOUTH EVERY DAY  . venlafaxine XR (EFFEXOR-XR) 75 MG 24 hr capsule Take 75 mg by mouth daily.     Allergies:   Hydrocodone-acetaminophen and Simvastatin   Social History   Socioeconomic History  . Marital status: Married    Spouse name: Not on file  . Number of children: Not on file  . Years of education: Not on file  . Highest education level: Not on  file  Occupational History  . Not on file  Tobacco Use  . Smoking status: Former Research scientist (life sciences)  . Smokeless tobacco: Former Network engineer  . Vaping Use: Never used  Substance and Sexual Activity  . Alcohol use: No  . Drug use: No  . Sexual activity: Not on file  Other Topics Concern  . Not on file  Social History Narrative  . Not on file   Social Determinants of Health   Financial Resource Strain: Not on file  Food Insecurity: Not on file  Transportation Needs: Not on file  Physical Activity: Not on file  Stress: Not on file  Social Connections: Not on file      Family History: The patient's family history includes Heart murmur in his mother; Prostate cancer in his father. ROS:   Please see the history of present illness.    All 14 point review of systems negative except as described per history of present illness  EKGs/Labs/Other Studies Reviewed:      Recent Labs: No results found for requested labs within last 8760 hours.  Recent Lipid Panel No results found for: CHOL, TRIG, HDL, CHOLHDL, VLDL, LDLCALC, LDLDIRECT  Physical Exam:    VS:  BP 130/80 (BP Location: Right Arm, Patient Position: Sitting)   Pulse 75   Ht 5\' 9"  (1.753 m)   Wt 155 lb (70.3 kg)   SpO2 96%   BMI 22.89 kg/m     Wt Readings from Last 3 Encounters:  06/05/20 155 lb (70.3 kg)  05/13/20 159 lb (72.1 kg)  02/21/20 157 lb (71.2 kg)     GEN:  Well nourished, well developed in no acute distress HEENT: Normal NECK: No JVD; No carotid bruits LYMPHATICS: No lymphadenopathy CARDIAC: RRR, systolic ejection murmur grade 3/6 best heard right upper portion of the sternum, no rubs, no gallops RESPIRATORY:  Clear to auscultation without rales, wheezing or rhonchi  ABDOMEN: Soft, non-tender, non-distended MUSCULOSKELETAL:  No edema; No deformity  SKIN: Warm and dry LOWER EXTREMITIES: no swelling NEUROLOGIC:  Alert and oriented x 3 PSYCHIATRIC:  Normal affect   ASSESSMENT:    1. Nonrheumatic aortic valve stenosis   2. Coronary artery disease involving native coronary artery of native heart without angina pectoris   3. Dilated cardiomyopathy ([place])   4. Late onset Alzheimer's disease without behavioral disturbance ([place])   5. Dyslipidemia    PLAN:    In order of problems listed above:  1. Nonrheumatic aortic valve stenosis which I think is significant.  Reaching criteria for potential intervention.  However he does not want to do that.  I again explained to him what the consequences of not fixing the valve I told him also that people with a still having  symptomatology and he does have some shortness of breath typically between 3 and 5 years.  He said he want to think about it more.  He is not ready to have the surgery. 2. Coronary disease stable on appropriate medications which I will continue. 3. History of cardiomyopathy latest ejection fraction 5055%.  We will continue present management. 4. Late onset of dementia.  All his communication with him was difficult and I was hoping that he will bring some family members today to talk about it.  He promised that he will do it next time. 5. Dyslipidemia, continue on statin.   Medication Adjustments/Labs and Tests Ordered: Current medicines are reviewed at length with the patient today.  Concerns regarding medicines are outlined above.  No  orders of the defined types were placed in this encounter.  Medication changes: No orders of the defined types were placed in this encounter.   Signed, Park Liter, MD, [facility] 06/05/2020 10:04 AM    [place]

## 2020-06-05 NOTE — Patient Instructions (Signed)
Medication Instructions:  Your physician recommends that you continue on your current medications as directed. Please refer to the Current Medication list given to you today.  *If you need a refill on your cardiac medications before your next appointment, please call your pharmacy*   Lab Work: None If you have labs (blood work) drawn today and your tests are completely normal, you will receive your results only by: Marland Kitchen MyChart Message (if you have MyChart) OR . A paper copy in the mail If you have any lab test that is abnormal or we need to change your treatment, we will call you to review the results.   Testing/Procedures: none   Follow-Up: At [facility], you and your health needs are our priority.  As part of our continuing mission to provide you with exceptional heart care, we have created designated Provider Care Teams.  These Care Teams include your primary Cardiologist (physician) and Advanced Practice Providers (APPs -  Physician Assistants and Nurse Practitioners) who all work together to provide you with the care you need, when you need it.  We recommend signing up for the patient portal called "MyChart".  Sign up information is provided on this After Visit Summary.  MyChart is used to connect with patients for Virtual Visits (Telemedicine).  Patients are able to view lab/test results, encounter notes, upcoming appointments, etc.  Non-urgent messages can be sent to your provider as well.   To learn more about what you can do with MyChart, go to [email or web address].    Your next appointment:   2 month(s)  The format for your next appointment:   In Person  Provider:   Jenne Campus, MD   Other Instructions

## 2020-07-05 ENCOUNTER — Other Ambulatory Visit: Payer: Self-pay | Admitting: Cardiology

## 2020-08-20 ENCOUNTER — Ambulatory Visit: Payer: Medicare HMO | Admitting: Cardiology

## 2020-09-13 ENCOUNTER — Ambulatory Visit: Payer: Medicare HMO | Admitting: Cardiology

## 2020-10-09 ENCOUNTER — Ambulatory Visit: Payer: Medicare HMO | Admitting: Cardiology

## 2020-10-25 ENCOUNTER — Ambulatory Visit: Payer: Medicare HMO | Admitting: Cardiology

## 2020-11-13 ENCOUNTER — Other Ambulatory Visit: Payer: Self-pay | Admitting: Cardiology

## 2020-12-02 ENCOUNTER — Other Ambulatory Visit: Payer: Self-pay | Admitting: Cardiology

## 2021-01-02 ENCOUNTER — Ambulatory Visit: Payer: Medicare HMO | Admitting: Cardiology

## 2021-01-02 ENCOUNTER — Other Ambulatory Visit: Payer: Self-pay

## 2021-01-02 ENCOUNTER — Encounter: Payer: Self-pay | Admitting: Cardiology

## 2021-01-02 VITALS — BP 160/100 | HR 89 | Ht 69.0 in | Wt 153.0 lb

## 2021-01-02 DIAGNOSIS — I420 Dilated cardiomyopathy: Secondary | ICD-10-CM | POA: Diagnosis not present

## 2021-01-02 DIAGNOSIS — I10 Essential (primary) hypertension: Secondary | ICD-10-CM | POA: Diagnosis not present

## 2021-01-02 DIAGNOSIS — E785 Hyperlipidemia, unspecified: Secondary | ICD-10-CM

## 2021-01-02 DIAGNOSIS — Z91199 Patient's noncompliance with other medical treatment and regimen due to unspecified reason: Secondary | ICD-10-CM

## 2021-01-02 DIAGNOSIS — Z23 Encounter for immunization: Secondary | ICD-10-CM

## 2021-01-02 DIAGNOSIS — I350 Nonrheumatic aortic (valve) stenosis: Secondary | ICD-10-CM | POA: Diagnosis not present

## 2021-01-02 NOTE — Progress Notes (Signed)
1 Cardiology Office Note:    Date:  01/02/2021   ID:  Ernest Frederick., DOB [birth year], MRN [id number]  PCP:  Algis Greenhouse, MD  Cardiologist:  Jenne Campus, MD    Referring MD: Algis Greenhouse, MD   Chief Complaint  Patient presents with   Follow-up  I am doing fine  History of Present Illness:    Ernest Frederick. is a 70 y.o. male with past medical history significant for aortic stenosis which appears to be low-flow low gradient severe arctic stenosis, cardiomyopathy with ejection fraction 50 to 55%, benign essential hypertension, dyslipidemia, dementia.  We have been dealing with his aortic stenosis problem for already months had multiple discussion with him and explained to him that surgery would be beneficial we were thinking about potentially doing TAVI.  However he still does not want to proceed.  He told me straight today that God will take care of him.  Luckily he denies have any symptomatology.  He denies have any chest pain tightness squeezing pressure burning chest, there is no shortness of breath there is no dizziness or passing out.  Communication with him however is very difficult because of his speech difficulties.  Apparently he lives alone.  He used to come with his wife however he tells me now that his wife lives with his daughter and both of them lives in [place].  He lives alone.  Past Medical History:  Diagnosis Date   Anemia, iron deficiency 11/10/2015   Aortic stenosis 09/26/2015   Overview:  2017: ECHO moderate with LVH, EF 55%   Benign hypertension 11/10/2015   Cardiomyopathy ([place]) 09/12/2015   Carotid bruit 09/12/2015   Colonoscopy refused 02/25/2017   2018: due to AS   Coronary artery disease 11/10/2015   Dyslipidemia 09/12/2015   Gastroesophageal reflux disease without esophagitis 05/12/2015   Hodgkin lymphoma ([place]) 11/10/2015   Hyperlipidemia    Hypertension    Late onset Alzheimer's disease without behavioral disturbance ([place]) 05/12/2015    Peripheral vascular disease ([place]) 07/30/2019   Formatting of this note might be different from the original. 2021: ABI screening, right 1.08, left 0.76   Pulmonary embolism ([place])    Thyroid nodule    Wellness examination 04/07/2017   2018: colonoscopy declined, risk normal    Past Surgical History:  Procedure Laterality Date   CERVICAL FUSION     SPLENECTOMY      Current Medications: Current Meds  Medication Sig   carvedilol (COREG) 3.125 MG tablet TAKE 1 TABLET (3.125 MG TOTAL) BY MOUTH 2 (TWO) TIMES DAILY.   donepezil (ARICEPT) 10 MG tablet Take 10 mg by mouth daily.   lisinopril (ZESTRIL) 10 MG tablet TAKE 1 TABLET BY MOUTH EVERY DAY   Multiple Vitamin (MULTIVITAMIN) capsule Take 1 capsule by mouth daily. Unknown strength per patient   nitroGLYCERIN (NITROSTAT) 0.4 MG SL tablet Place 1 tablet (0.4 mg total) under the tongue as needed for chest pain.   omeprazole (PRILOSEC) 40 MG capsule Take 1 capsule by mouth daily.   pravastatin (PRAVACHOL) 20 MG tablet TAKE 1 TABLET BY MOUTH EVERY DAY   rosuvastatin (CRESTOR) 20 MG tablet Take 20 mg by mouth daily.   spironolactone (ALDACTONE) 25 MG tablet TAKE 1 TABLET BY MOUTH EVERY DAY   venlafaxine XR (EFFEXOR-XR) 75 MG 24 hr capsule Take 75 mg by mouth daily.     Allergies:   Hydrocodone-acetaminophen and Simvastatin   Social History   Socioeconomic History   Marital status: Married  Spouse name: Not on file   Number of children: Not on file   Years of education: Not on file   Highest education level: Not on file  Occupational History   Not on file  Tobacco Use   Smoking status: Former   Smokeless tobacco: Former  Scientific laboratory technician Use: Never used  Substance and Sexual Activity   Alcohol use: No   Drug use: No   Sexual activity: Not on file  Other Topics Concern   Not on file  Social History Narrative   Not on file   Social Determinants of Health   Financial Resource Strain: Not on file  Food Insecurity: Not on  file  Transportation Needs: Not on file  Physical Activity: Not on file  Stress: Not on file  Social Connections: Not on file     Family History: The patient's family history includes Heart murmur in his mother; Prostate cancer in his father. ROS:   Please see the history of present illness.    All 14 point review of systems negative except as described per history of present illness  EKGs/Labs/Other Studies Reviewed:      Recent Labs: No results found for requested labs within last 8760 hours.  Recent Lipid Panel No results found for: CHOL, TRIG, HDL, CHOLHDL, VLDL, LDLCALC, LDLDIRECT  Physical Exam:    VS:  BP (!) 160/100 (BP Location: Left Arm, Patient Position: Sitting, Cuff Size: Normal)   Pulse 89   Ht 5\' 9"  (1.753 m)   Wt 153 lb (69.4 kg)   SpO2 97%   BMI 22.59 kg/m     Wt Readings from Last 3 Encounters:  01/02/21 153 lb (69.4 kg)  06/05/20 155 lb (70.3 kg)  05/13/20 159 lb (72.1 kg)     GEN:  Well nourished, well developed in no acute distress HEENT: Normal NECK: No JVD; No carotid bruits LYMPHATICS: No lymphadenopathy CARDIAC: RRR, systolic ejection murmur grade 2/6 to 3/6 best heard right upper portion of the sternum, S2 soft, no rubs, no gallops RESPIRATORY:  Clear to auscultation without rales, wheezing or rhonchi  ABDOMEN: Soft, non-tender, non-distended MUSCULOSKELETAL:  No edema; No deformity  SKIN: Warm and dry LOWER EXTREMITIES: no swelling NEUROLOGIC:  Alert and oriented x 3 PSYCHIATRIC:  Normal affect   ASSESSMENT:    1. Nonrheumatic aortic valve stenosis   2. Benign hypertension   3. Dilated cardiomyopathy ([place])   4. Dyslipidemia   5. Noncompliance    PLAN:    In order of problems listed above:  Severe aortic stenosis which is low-flow low gradient patient landed he does not have any symptoms he does not want to do anything about his valve.  He tells me straight cath will take care of this.  I explained again the significance of this  finding and I also told him that survival people with aortic stenosis when they start having symptoms next 3 to 5 years.  But he told me that he is perfectly fine with it.  Again luckily he denies have any symptoms of severe aortic stenosis benign essential hypertension, his blood pressure is elevated today, uncontrolled but he tells me that he ran out of his medication about 5 days ago and did not have time to get it I told him that this is critically essential to take all medications the way he should. Dilated cardiomyopathy ejection fraction 5055% hemodynamically compensated no evidence of CHF on physical exam Dyslipidemia: He did not take his medication therefore  I will not be able to check his fasting lipid profile.  We will continue monitoring   Overall suicide situation identity he fully grasps the seriousness of his condition we will repeat echocardiogram in January and I will talk to him again.  I asked him to bring family members with him   Medication Adjustments/Labs and Tests Ordered: Current medicines are reviewed at length with the patient today.  Concerns regarding medicines are outlined above.  No orders of the defined types were placed in this encounter.  Medication changes: No orders of the defined types were placed in this encounter.   Signed, Park Liter, MD, [facility] 01/02/2021 8:41 AM    [place]

## 2021-01-02 NOTE — Patient Instructions (Signed)
Medication Instructions:  Your physician recommends that you continue on your current medications as directed. Please refer to the Current Medication list given to you today.  *If you need a refill on your cardiac medications before your next appointment, please call your pharmacy*   Lab Work: None ordered If you have labs (blood work) drawn today and your tests are completely normal, you will receive your results only by: [place] (if you have MyChart) OR A paper copy in the mail If you have any lab test that is abnormal or we need to change your treatment, we will call you to review the results.   Testing/Procedures: Your physician has requested that you have an echocardiogram. Echocardiography is a painless test that uses sound waves to create images of your heart. It provides your doctor with information about the size and shape of your heart and how well your heart's chambers and valves are working. This procedure takes approximately one hour. There are no restrictions for this procedure.    Follow-Up: At [facility], you and your health needs are our priority.  As part of our continuing mission to provide you with exceptional heart care, we have created designated Provider Care Teams.  These Care Teams include your primary Cardiologist (physician) and Advanced Practice Providers (APPs -  Physician Assistants and Nurse Practitioners) who all work together to provide you with the care you need, when you need it.  We recommend signing up for the patient portal called "MyChart".  Sign up information is provided on this After Visit Summary.  MyChart is used to connect with patients for Virtual Visits (Telemedicine).  Patients are able to view lab/test results, encounter notes, upcoming appointments, etc.  Non-urgent messages can be sent to your provider as well.   To learn more about what you can do with MyChart, go to [email or web address].    Your next appointment:   3  month(s)  The format for your next appointment:   In Person  Provider:   Jenne Campus, MD   Other Instructions Echocardiogram An echocardiogram is a test that uses sound waves (ultrasound) to produce images of the heart. Images from an echocardiogram can provide important information about: Heart size and shape. The size and thickness and movement of your heart's walls. Heart muscle function and strength. Heart valve function or if you have stenosis. Stenosis is when the heart valves are too narrow. If blood is flowing backward through the heart valves (regurgitation). A tumor or infectious growth around the heart valves. Areas of heart muscle that are not working well because of poor blood flow or injury from a heart attack. Aneurysm detection. An aneurysm is a weak or damaged part of an artery wall. The wall bulges out from the normal force of blood pumping through the body. Tell a health care provider about: Any allergies you have. All medicines you are taking, including vitamins, herbs, eye drops, creams, and over-the-counter medicines. Any blood disorders you have. Any surgeries you have had. Any medical conditions you have. Whether you are pregnant or may be pregnant. What are the risks? Generally, this is a safe test. However, problems may occur, including an allergic reaction to dye (contrast) that may be used during the test. What happens before the test? No specific preparation is needed. You may eat and drink normally. What happens during the test? You will take off your clothes from the waist up and put on a hospital gown. Electrodes or electrocardiogram (ECG)patches may be placed on  your chest. The electrodes or patches are then connected to a device that monitors your heart rate and rhythm. You will lie down on a table for an ultrasound exam. A gel will be applied to your chest to help sound waves pass through your skin. A handheld device, called a transducer,  will be pressed against your chest and moved over your heart. The transducer produces sound waves that travel to your heart and bounce back (or "echo" back) to the transducer. These sound waves will be captured in real-time and changed into images of your heart that can be viewed on a video monitor. The images will be recorded on a computer and reviewed by your health care provider. You may be asked to change positions or hold your breath for a short time. This makes it easier to get different views or better views of your heart. In some cases, you may receive contrast through an IV in one of your veins. This can improve the quality of the pictures from your heart. The procedure may vary among health care providers and hospitals.   What can I expect after the test? You may return to your normal, everyday life, including diet, activities, and medicines, unless your health care provider tells you not to do that. Follow these instructions at home: It is up to you to get the results of your test. Ask your health care provider, or the department that is doing the test, when your results will be ready. Keep all follow-up visits. This is important. Summary An echocardiogram is a test that uses sound waves (ultrasound) to produce images of the heart. Images from an echocardiogram can provide important information about the size and shape of your heart, heart muscle function, heart valve function, and other possible heart problems. You do not need to do anything to prepare before this test. You may eat and drink normally. After the echocardiogram is completed, you may return to your normal, everyday life, unless your health care provider tells you not to do that. This information is not intended to replace advice given to you by your health care provider. Make sure you discuss any questions you have with your health care provider. Document Revised: 10/24/2019 Document Reviewed: 10/24/2019 Elsevier Patient  Education  2021 [employer].

## 2021-01-16 ENCOUNTER — Other Ambulatory Visit: Payer: Medicare HMO

## 2021-03-20 ENCOUNTER — Other Ambulatory Visit: Payer: Self-pay

## 2021-03-20 ENCOUNTER — Ambulatory Visit (INDEPENDENT_AMBULATORY_CARE_PROVIDER_SITE_OTHER): Payer: Medicare HMO

## 2021-03-20 DIAGNOSIS — I350 Nonrheumatic aortic (valve) stenosis: Secondary | ICD-10-CM

## 2021-03-20 LAB — ECHOCARDIOGRAM COMPLETE
AR max vel: 0.78 cm2
AV Area VTI: 0.88 cm2
AV Area mean vel: 0.71 cm2
AV Mean grad: 32 mmHg
AV Peak grad: 53.3 mmHg
Ao pk vel: 3.65 m/s
Area-P 1/2: 4.8 cm2
MV VTI: 1.89 cm2
P 1/2 time: 374 msec
S' Lateral: 3.4 cm

## 2021-03-21 ENCOUNTER — Telehealth: Payer: Self-pay

## 2021-03-21 NOTE — Telephone Encounter (Signed)
-----   Message from Park Liter, MD sent at 03/21/2021 11:10 AM EST ----- Moderate to severe aortic stenosis, still medical therapy.

## 2021-03-21 NOTE — Telephone Encounter (Signed)
Spoke with patient wife Paullette on HIPAA, notified of results

## 2021-04-01 ENCOUNTER — Telehealth: Payer: Self-pay | Admitting: Cardiology

## 2021-04-01 DIAGNOSIS — I10 Essential (primary) hypertension: Secondary | ICD-10-CM

## 2021-04-01 MED ORDER — LISINOPRIL 10 MG PO TABS: 10.0000 mg | ORAL_TABLET | Freq: Every day | ORAL | 2 refills | Status: AC

## 2021-04-01 NOTE — Telephone Encounter (Signed)
RX sent to pharmacy  

## 2021-04-01 NOTE — Telephone Encounter (Signed)
° ° °*  STAT* If patient is at the pharmacy, call can be transferred to refill team.   1. Which medications need to be refilled? (please list name of each medication and dose if known) lisinopril (ZESTRIL) 10 MG tablet  2. Which pharmacy/location (including street and city if local pharmacy) is medication to be sent to? [id number] - [place], [place] - [address]  3. Do they need a 30 day or 90 day supply? 90 days

## 2021-04-10 ENCOUNTER — Encounter: Payer: Self-pay | Admitting: Cardiology

## 2021-04-10 ENCOUNTER — Other Ambulatory Visit: Payer: Self-pay

## 2021-04-10 ENCOUNTER — Ambulatory Visit: Payer: Medicare HMO | Admitting: Cardiology

## 2021-04-10 VITALS — BP 140/76 | HR 82 | Ht 69.0 in | Wt 157.0 lb

## 2021-04-10 DIAGNOSIS — I2510 Atherosclerotic heart disease of native coronary artery without angina pectoris: Secondary | ICD-10-CM

## 2021-04-10 DIAGNOSIS — E785 Hyperlipidemia, unspecified: Secondary | ICD-10-CM

## 2021-04-10 DIAGNOSIS — I350 Nonrheumatic aortic (valve) stenosis: Secondary | ICD-10-CM

## 2021-04-10 DIAGNOSIS — I420 Dilated cardiomyopathy: Secondary | ICD-10-CM

## 2021-04-10 DIAGNOSIS — Z91199 Patient's noncompliance with other medical treatment and regimen due to unspecified reason: Secondary | ICD-10-CM

## 2021-04-10 NOTE — Patient Instructions (Signed)
Medication Instructions:  Your physician recommends that you continue on your current medications as directed. Please refer to the Current Medication list given to you today.  *If you need a refill on your cardiac medications before your next appointment, please call your pharmacy*   Lab Work: None If you have labs (blood work) drawn today and your tests are completely normal, you will receive your results only by: [place] (if you have MyChart) OR A paper copy in the mail If you have any lab test that is abnormal or we need to change your treatment, we will call you to review the results.   Testing/Procedures: Your physician has requested that you have an echocardiogram. Echocardiography is a painless test that uses sound waves to create images of your heart. It provides your doctor with information about the size and shape of your heart and how well your hearts chambers and valves are working. This procedure takes approximately one hour. There are no restrictions for this procedure.    Follow-Up: At [facility], you and your health needs are our priority.  As part of our continuing mission to provide you with exceptional heart care, we have created designated Provider Care Teams.  These Care Teams include your primary Cardiologist (physician) and Advanced Practice Providers (APPs -  Physician Assistants and Nurse Practitioners) who all work together to provide you with the care you need, when you need it.  We recommend signing up for the patient portal called "MyChart".  Sign up information is provided on this After Visit Summary.  MyChart is used to connect with patients for Virtual Visits (Telemedicine).  Patients are able to view lab/test results, encounter notes, upcoming appointments, etc.  Non-urgent messages can be sent to your provider as well.   To learn more about what you can do with MyChart, go to [email or web address].    Your next appointment:   5  month(s)  The format for your next appointment:   In Person  Provider:   Jenne Campus, MD    Other Instructions None

## 2021-04-10 NOTE — Progress Notes (Signed)
Cardiology Office Note:    Date:  04/10/2021   ID:  Ernest Frederick., DOB [birth year], MRN [id number]  PCP:  Algis Greenhouse, MD  Cardiologist:  Jenne Campus, MD    Referring MD: Algis Greenhouse, MD   Chief Complaint  Patient presents with   Follow-up  I am doing fine  History of Present Illness:    Ernest Frederick. is a 71 y.o. male with past medical history significant for arctic stenosis appears to be low-flow low gradient moderate to severe aortic stenosis.  He has a history of cardiomyopathy usually ejection fraction 50 to 55%, benign essential hypertension, dyslipidemia, dementia.  We had repeated visits Talking about his aortic stenosis I told him that he would be potential candidate for TAVI or maybe even open heart surgery he does not want to do it he said that God will take care of this he said that it has become very bad and he will may consider doing it.  He already tells me that he does not have any chest pain tightness squeezing pressure burning chest no dizziness but walking will give him shortness of breath to the point that he have to sit again explained to him that this is most likely coming from the aortic stenosis.  However he still prefer just to watch the situation.  Mom and I warned him about all symptoms of critical aortic stenosis which include shortness of breath dizziness passing out and chest pain.  I will ask him to let me know if it happens.  Past Medical History:  Diagnosis Date   Anemia, iron deficiency 11/10/2015   Aortic stenosis 09/26/2015   Overview:  2017: ECHO moderate with LVH, EF 55%   Benign hypertension 11/10/2015   Cardiomyopathy ([place]) 09/12/2015   Carotid bruit 09/12/2015   Colonoscopy refused 02/25/2017   2018: due to AS   Coronary artery disease 11/10/2015   Dyslipidemia 09/12/2015   Gastroesophageal reflux disease without esophagitis 05/12/2015   Hodgkin lymphoma ([place]) 11/10/2015   Hyperlipidemia    Hypertension    Late onset  Alzheimer's disease without behavioral disturbance ([place]) 05/12/2015   Peripheral vascular disease ([place]) 07/30/2019   Formatting of this note might be different from the original. 2021: ABI screening, right 1.08, left 0.76   Pulmonary embolism ([place])    Thyroid nodule    Wellness examination 04/07/2017   2018: colonoscopy declined, risk normal    Past Surgical History:  Procedure Laterality Date   CERVICAL FUSION     SPLENECTOMY      Current Medications: Current Meds  Medication Sig   carvedilol (COREG) 3.125 MG tablet TAKE 1 TABLET (3.125 MG TOTAL) BY MOUTH 2 (TWO) TIMES DAILY.   donepezil (ARICEPT) 10 MG tablet Take 10 mg by mouth daily.   lisinopril (ZESTRIL) 10 MG tablet Take 1 tablet (10 mg total) by mouth daily.   Multiple Vitamin (MULTIVITAMIN) capsule Take 1 capsule by mouth daily. Unknown strength per patient   nitroGLYCERIN (NITROSTAT) 0.4 MG SL tablet Place 1 tablet (0.4 mg total) under the tongue as needed for chest pain.   omeprazole (PRILOSEC) 40 MG capsule Take 1 capsule by mouth daily.   pravastatin (PRAVACHOL) 20 MG tablet TAKE 1 TABLET BY MOUTH EVERY DAY (Patient taking differently: Take 20 mg by mouth daily.)   rosuvastatin (CRESTOR) 20 MG tablet Take 20 mg by mouth daily.   spironolactone (ALDACTONE) 25 MG tablet TAKE 1 TABLET BY MOUTH EVERY DAY (Patient taking differently: Take 25  mg by mouth daily.)   venlafaxine XR (EFFEXOR-XR) 75 MG 24 hr capsule Take 75 mg by mouth daily.     Allergies:   Hydrocodone-acetaminophen and Simvastatin   Social History   Socioeconomic History   Marital status: Married    Spouse name: Not on file   Number of children: Not on file   Years of education: Not on file   Highest education level: Not on file  Occupational History   Not on file  Tobacco Use   Smoking status: Former   Smokeless tobacco: Former  Scientific laboratory technician Use: Never used  Substance and Sexual Activity   Alcohol use: No   Drug use: No   Sexual activity:  Not on file  Other Topics Concern   Not on file  Social History Narrative   Not on file   Social Determinants of Health   Financial Resource Strain: Not on file  Food Insecurity: Not on file  Transportation Needs: Not on file  Physical Activity: Not on file  Stress: Not on file  Social Connections: Not on file     Family History: The patient's family history includes Heart murmur in his mother; Prostate cancer in his father. ROS:   Please see the history of present illness.    All 14 point review of systems negative except as described per history of present illness  EKGs/Labs/Other Studies Reviewed:      Recent Labs: No results found for requested labs within last 8760 hours.  Recent Lipid Panel No results found for: CHOL, TRIG, HDL, CHOLHDL, VLDL, LDLCALC, LDLDIRECT  Physical Exam:    VS:  BP 140/76 (BP Location: Left Arm, Patient Position: Sitting)    Pulse 82    Ht 5\' 9"  (1.753 m)    Wt 157 lb (71.2 kg)    SpO2 97%    BMI 23.18 kg/m     Wt Readings from Last 3 Encounters:  04/10/21 157 lb (71.2 kg)  01/02/21 153 lb (69.4 kg)  06/05/20 155 lb (70.3 kg)     GEN:  Well nourished, well developed in no acute distress HEENT: Normal NECK: No JVD; No carotid bruits LYMPHATICS: No lymphadenopathy CARDIAC: RRR, systolic ejection murmur late peaking best heard right upper portion of sternum, no rubs, no gallops RESPIRATORY:  Clear to auscultation without rales, wheezing or rhonchi  ABDOMEN: Soft, non-tender, non-distended MUSCULOSKELETAL:  No edema; No deformity  SKIN: Warm and dry LOWER EXTREMITIES: no swelling NEUROLOGIC:  Alert and oriented x 3 PSYCHIATRIC:  Normal affect   ASSESSMENT:    1. Nonrheumatic aortic valve stenosis   2. Dilated cardiomyopathy ([place])   3. Coronary artery disease involving native coronary artery of native heart without angina pectoris   4. Dyslipidemia   5. Noncompliance    PLAN:    In order of problems listed  above:  Nonrheumatic arctic valve stenosis low-flow low gradient however echocardiogram done recently looks slightly better look like more moderate rather than severe did he have symptoms of shortness of breath while walking History of dilated cardiomyopathy ejection fraction seems to be lower limits of normal continue present management. Coronary disease denies have any symptoms of it.  Continue conservative approach.  I told him if he decided to do about surgery then we will do cardiac catheterization first. Dyslipidemia,I did review data from his primary care physician with LDL of 69 HDL 77 continue present management   Medication Adjustments/Labs and Tests Ordered: Current medicines are reviewed at length with  the patient today.  Concerns regarding medicines are outlined above.  No orders of the defined types were placed in this encounter.  Medication changes: No orders of the defined types were placed in this encounter.   Signed, Park Liter, MD, [facility] 04/10/2021 12:52 PM    [place]

## 2021-04-21 ENCOUNTER — Other Ambulatory Visit: Payer: Self-pay | Admitting: Cardiology

## 2021-04-21 ENCOUNTER — Ambulatory Visit (INDEPENDENT_AMBULATORY_CARE_PROVIDER_SITE_OTHER): Payer: Medicare HMO

## 2021-04-21 ENCOUNTER — Other Ambulatory Visit: Payer: Self-pay

## 2021-04-21 DIAGNOSIS — I2510 Atherosclerotic heart disease of native coronary artery without angina pectoris: Secondary | ICD-10-CM

## 2021-04-21 DIAGNOSIS — Z91199 Patient's noncompliance with other medical treatment and regimen due to unspecified reason: Secondary | ICD-10-CM

## 2021-04-21 DIAGNOSIS — E785 Hyperlipidemia, unspecified: Secondary | ICD-10-CM

## 2021-04-21 DIAGNOSIS — I350 Nonrheumatic aortic (valve) stenosis: Secondary | ICD-10-CM

## 2021-04-21 DIAGNOSIS — I420 Dilated cardiomyopathy: Secondary | ICD-10-CM

## 2021-04-21 LAB — ECHOCARDIOGRAM LIMITED
AR max vel: 0.67 cm2
AV Area VTI: 0.65 cm2
AV Area mean vel: 0.6 cm2
AV Mean grad: 29.3 mmHg
AV Peak grad: 51.2 mmHg
Ao pk vel: 3.58 m/s
Calc EF: 44.1 %
P 1/2 time: 407 msec
S' Lateral: 3.3 cm
Single Plane A2C EF: 43.7 %
Single Plane A4C EF: 46.9 %

## 2021-05-13 ENCOUNTER — Other Ambulatory Visit: Payer: Self-pay | Admitting: Cardiology

## 2021-05-14 ENCOUNTER — Telehealth: Payer: Self-pay | Admitting: Cardiology

## 2021-05-14 NOTE — Telephone Encounter (Signed)
?*  STAT* If patient is at the pharmacy, call can be transferred to refill team. ? ? ?1. Which medications need to be refilled? (please list name of each medication and dose if known) need a new prescription for Pravastatin ? ?2. Which pharmacy/location (including street and city if local pharmacy) is medication to be sent to?  [facility], [place] ? ?3. Do they need a 30 day or 90 day supply? 90 days and refills ? ?

## 2021-05-19 ENCOUNTER — Other Ambulatory Visit: Payer: Self-pay

## 2021-05-19 NOTE — Telephone Encounter (Signed)
Spoke with patient wife Paultette. Advised per Dr. Agustin Cree patient should be on Rosuvastatin not pravastatin. I confirmed the patient has plenty tabs of Crestor.  ?

## 2021-05-23 ENCOUNTER — Other Ambulatory Visit: Payer: Self-pay | Admitting: Cardiology

## 2021-05-29 NOTE — Telephone Encounter (Signed)
Pt takes resuvastatin already ?

## 2021-06-02 ENCOUNTER — Other Ambulatory Visit: Payer: Self-pay | Admitting: Cardiology

## 2021-09-02 ENCOUNTER — Ambulatory Visit: Payer: Medicare HMO | Admitting: Cardiology

## 2021-09-02 ENCOUNTER — Encounter: Payer: Self-pay | Admitting: Cardiology

## 2021-09-02 VITALS — BP 124/76 | HR 86 | Ht 69.0 in | Wt 150.4 lb

## 2021-09-02 DIAGNOSIS — Z91199 Patient's noncompliance with other medical treatment and regimen due to unspecified reason: Secondary | ICD-10-CM

## 2021-09-02 DIAGNOSIS — E782 Mixed hyperlipidemia: Secondary | ICD-10-CM

## 2021-09-02 DIAGNOSIS — I10 Essential (primary) hypertension: Secondary | ICD-10-CM

## 2021-09-02 DIAGNOSIS — I350 Nonrheumatic aortic (valve) stenosis: Secondary | ICD-10-CM | POA: Diagnosis not present

## 2021-09-02 DIAGNOSIS — I420 Dilated cardiomyopathy: Secondary | ICD-10-CM

## 2021-09-02 NOTE — Patient Instructions (Signed)
Medication Instructions:  Your physician recommends that you continue on your current medications as directed. Please refer to the Current Medication list given to you today.  *If you need a refill on your cardiac medications before your next appointment, please call your pharmacy*   Lab Work: NONE If you have labs (blood work) drawn today and your tests are completely normal, you will receive your results only by: [place] (if you have MyChart) OR A paper copy in the mail If you have any lab test that is abnormal or we need to change your treatment, we will call you to review the results.   Testing/Procedures: NONE   Follow-Up: At [facility], you and your health needs are our priority.  As part of our continuing mission to provide you with exceptional heart care, we have created designated Provider Care Teams.  These Care Teams include your primary Cardiologist (physician) and Advanced Practice Providers (APPs -  Physician Assistants and Nurse Practitioners) who all work together to provide you with the care you need, when you need it.  We recommend signing up for the patient portal called "MyChart".  Sign up information is provided on this After Visit Summary.  MyChart is used to connect with patients for Virtual Visits (Telemedicine).  Patients are able to view lab/test results, encounter notes, upcoming appointments, etc.  Non-urgent messages can be sent to your provider as well.   To learn more about what you can do with MyChart, go to [email or web address].    Your next appointment:   4 month(s)  The format for your next appointment:   In Person  Provider:   Jenne Campus, MD    Other Instructions   Important Information About [place]

## 2021-09-02 NOTE — Progress Notes (Signed)
Cardiology Office Note:    Date:  09/02/2021   ID:  Ernest Frederick., DOB 07-Jan-1951, MRN [id number]  PCP:  Algis Greenhouse, MD  Cardiologist:  Jenne Campus, MD    Referring MD: Algis Greenhouse, MD   Chief Complaint  Patient presents with   Follow-up    History of Present Illness:    Ernest Frederick. is a 71 y.o. male with past medical history significant for severe arctic stenosis also cardiomyopathy with ejection fraction 50 to 55%.  I brought him again to my office to talk about options for this situation option being TAVI versus open heart surgery however he still does not want to pursue it he said he is doing well he described the fact that he was able to cut the grass with no difficulties.  At the same time he tells me that he is getting short of breath and tired quite easily.  He always tells me that God will take care of him and he does not want anything done about that.  Past Medical History:  Diagnosis Date   Anemia, iron deficiency 11/10/2015   Aortic stenosis 09/26/2015   Overview:  2017: ECHO moderate with LVH, EF 55%   Benign hypertension 11/10/2015   Cardiomyopathy ([place]) 09/12/2015   Carotid bruit 09/12/2015   Colonoscopy refused 02/25/2017   2018: due to AS   Coronary artery disease 11/10/2015   Dyslipidemia 09/12/2015   Gastroesophageal reflux disease without esophagitis 05/12/2015   Hodgkin lymphoma ([place]) 11/10/2015   Hyperlipidemia    Hypertension    Late onset Alzheimer's disease without behavioral disturbance ([place]) 05/12/2015   Peripheral vascular disease ([place]) 07/30/2019   Formatting of this note might be different from the original. 2021: ABI screening, right 1.08, left 0.76   Pulmonary embolism ([place])    Thyroid nodule    Wellness examination 04/07/2017   2018: colonoscopy declined, risk normal    Past Surgical History:  Procedure Laterality Date   CERVICAL FUSION     SPLENECTOMY      Current Medications: Current Meds  Medication Sig    carvedilol (COREG) 3.125 MG tablet TAKE 1 TABLET (3.125 MG TOTAL) BY MOUTH 2 (TWO) TIMES DAILY.   donepezil (ARICEPT) 10 MG tablet Take 10 mg by mouth daily.   lisinopril (ZESTRIL) 10 MG tablet Take 1 tablet (10 mg total) by mouth daily.   Multiple Vitamin (MULTIVITAMIN) capsule Take 1 capsule by mouth daily. Unknown strength per patient   nitroGLYCERIN (NITROSTAT) 0.4 MG SL tablet Place 1 tablet (0.4 mg total) under the tongue as needed for chest pain.   omeprazole (PRILOSEC) 40 MG capsule Take 1 capsule by mouth daily.   pravastatin (PRAVACHOL) 20 MG tablet TAKE 1 TABLET BY MOUTH EVERY DAY (Patient taking differently: Take 20 mg by mouth daily.)   rosuvastatin (CRESTOR) 20 MG tablet Take 20 mg by mouth daily.   spironolactone (ALDACTONE) 25 MG tablet TAKE 1 TABLET BY MOUTH EVERY DAY (Patient taking differently: Take 25 mg by mouth daily.)   venlafaxine XR (EFFEXOR-XR) 75 MG 24 hr capsule Take 75 mg by mouth daily.     Allergies:   Hydrocodone-acetaminophen and Simvastatin   Social History   Socioeconomic History   Marital status: Married    Spouse name: Not on file   Number of children: Not on file   Years of education: Not on file   Highest education level: Not on file  Occupational History   Not on file  Tobacco  Use   Smoking status: Former   Smokeless tobacco: Former  Scientific laboratory technician Use: Never used  Substance and Sexual Activity   Alcohol use: No   Drug use: No   Sexual activity: Not on file  Other Topics Concern   Not on file  Social History Narrative   Not on file   Social Determinants of Health   Financial Resource Strain: Not on file  Food Insecurity: Not on file  Transportation Needs: Not on file  Physical Activity: Not on file  Stress: Not on file  Social Connections: Not on file     Family History: The patient's family history includes Heart murmur in his mother; Prostate cancer in his father. ROS:   Please see the history of present illness.     All 14 point review of systems negative except as described per history of present illness  EKGs/Labs/Other Studies Reviewed:      Recent Labs: No results found for requested labs within last 365 days.  Recent Lipid Panel No results found for: "CHOL", "TRIG", "HDL", "CHOLHDL", "VLDL", "LDLCALC", "LDLDIRECT"  Physical Exam:    VS:  BP 124/76 (BP Location: Left Arm, Patient Position: Sitting)   Pulse 86   Ht '5\' 9"'$  (1.753 m)   Wt 150 lb 6.4 oz (68.2 kg)   SpO2 98%   BMI 22.21 kg/m     Wt Readings from Last 3 Encounters:  09/02/21 150 lb 6.4 oz (68.2 kg)  04/10/21 157 lb (71.2 kg)  01/02/21 153 lb (69.4 kg)     GEN:  Well nourished, well developed in no acute distress HEENT: Normal NECK: No JVD; No carotid bruits LYMPHATICS: No lymphadenopathy CARDIAC: RRR, systolic ejection murmur grade 3/6 best heard right upper portion of the sternum, no rubs, no gallops RESPIRATORY:  Clear to auscultation without rales, wheezing or rhonchi  ABDOMEN: Soft, non-tender, non-distended MUSCULOSKELETAL:  No edema; No deformity  SKIN: Warm and dry LOWER EXTREMITIES: no swelling NEUROLOGIC:  Alert and oriented x 3 PSYCHIATRIC:  Normal affect   ASSESSMENT:    1. Nonrheumatic aortic valve stenosis   2. Dilated cardiomyopathy ([place])   3. Benign hypertension   4. Noncompliance   5. Mixed hyperlipidemia    PLAN:    In order of problems listed above:  Nonrheumatic severe aortic valve stenosis.  He needs to have it fixed however he does not want to again continue discussion with him about it he is said that he is doing fine and got we will take care of this. History of cardiomyopathy obviously because of his severe arctic stenosis I have very limited options terms of medication I can use loculated look like hemodynamically stable and doing quite well Essential hypertension blood pressure well controlled continue present management Dyslipidemia stable   Medication Adjustments/Labs and Tests  Ordered: Current medicines are reviewed at length with the patient today.  Concerns regarding medicines are outlined above.  No orders of the defined types were placed in this encounter.  Medication changes: No orders of the defined types were placed in this encounter.   Signed, Park Liter, MD, [facility] 09/02/2021 3:25 PM    [facility]

## 2021-10-14 DEATH — deceased

## 2022-01-02 ENCOUNTER — Ambulatory Visit: Payer: Medicare HMO | Admitting: Cardiology
